# Patient Record
Sex: Female | Born: 1950 | Race: White | Hispanic: No | Smoking: Never smoker
Health system: Southern US, Community
[De-identification: ages and names within clinical notes are randomized; demographics above are authoritative.]

## PROBLEM LIST (undated history)

## (undated) DIAGNOSIS — R112 Nausea with vomiting, unspecified: Secondary | ICD-10-CM

## (undated) DIAGNOSIS — T148XXA Other injury of unspecified body region, initial encounter: Secondary | ICD-10-CM

## (undated) DIAGNOSIS — K219 Gastro-esophageal reflux disease without esophagitis: Secondary | ICD-10-CM

## (undated) DIAGNOSIS — M810 Age-related osteoporosis without current pathological fracture: Principal | ICD-10-CM

## (undated) DIAGNOSIS — Z9889 Other specified postprocedural states: Secondary | ICD-10-CM

## (undated) DIAGNOSIS — M8580 Other specified disorders of bone density and structure, unspecified site: Secondary | ICD-10-CM

## (undated) HISTORY — DX: Other specified postprocedural states: Z98.890

## (undated) HISTORY — DX: Gastro-esophageal reflux disease without esophagitis: K21.9

## (undated) HISTORY — DX: Other specified disorders of bone density and structure, unspecified site: M85.80

## (undated) HISTORY — DX: Other injury of unspecified body region, initial encounter: T14.8XXA

## (undated) HISTORY — DX: Age-related osteoporosis without current pathological fracture: M81.0

## (undated) HISTORY — PX: AUGMENTATION MAMMAPLASTY: SUR837

## (undated) HISTORY — DX: Other specified postprocedural states: R11.2

## (undated) HISTORY — DX: Nausea with vomiting, unspecified: R11.2

---

## 1998-04-24 ENCOUNTER — Ambulatory Visit (HOSPITAL_COMMUNITY): Admission: RE | Admit: 1998-04-24 | Discharge: 1998-04-24 | Payer: Self-pay | Admitting: Internal Medicine

## 2000-06-15 ENCOUNTER — Ambulatory Visit (HOSPITAL_COMMUNITY): Admission: RE | Admit: 2000-06-15 | Discharge: 2000-06-15 | Payer: Self-pay | Admitting: Obstetrics and Gynecology

## 2000-06-15 ENCOUNTER — Encounter: Payer: Self-pay | Admitting: Obstetrics and Gynecology

## 2000-06-29 ENCOUNTER — Other Ambulatory Visit: Admission: RE | Admit: 2000-06-29 | Discharge: 2000-06-29 | Payer: Self-pay | Admitting: Obstetrics and Gynecology

## 2002-11-15 ENCOUNTER — Other Ambulatory Visit: Admission: RE | Admit: 2002-11-15 | Discharge: 2002-11-15 | Payer: Self-pay | Admitting: Obstetrics and Gynecology

## 2003-12-09 ENCOUNTER — Ambulatory Visit (HOSPITAL_COMMUNITY): Admission: RE | Admit: 2003-12-09 | Discharge: 2003-12-09 | Payer: Self-pay | Admitting: Obstetrics and Gynecology

## 2003-12-18 ENCOUNTER — Other Ambulatory Visit: Admission: RE | Admit: 2003-12-18 | Discharge: 2003-12-18 | Payer: Self-pay | Admitting: Obstetrics and Gynecology

## 2003-12-19 ENCOUNTER — Other Ambulatory Visit: Admission: RE | Admit: 2003-12-19 | Discharge: 2003-12-19 | Payer: Self-pay | Admitting: Obstetrics and Gynecology

## 2004-07-29 ENCOUNTER — Other Ambulatory Visit: Admission: RE | Admit: 2004-07-29 | Discharge: 2004-07-29 | Payer: Self-pay | Admitting: Obstetrics and Gynecology

## 2005-06-02 ENCOUNTER — Other Ambulatory Visit: Admission: RE | Admit: 2005-06-02 | Discharge: 2005-06-02 | Payer: Self-pay | Admitting: Obstetrics and Gynecology

## 2006-06-24 ENCOUNTER — Emergency Department (HOSPITAL_COMMUNITY): Admission: EM | Admit: 2006-06-24 | Discharge: 2006-06-24 | Payer: Self-pay | Admitting: Emergency Medicine

## 2006-06-30 ENCOUNTER — Ambulatory Visit: Payer: Self-pay | Admitting: Internal Medicine

## 2006-09-28 ENCOUNTER — Ambulatory Visit (HOSPITAL_COMMUNITY): Admission: RE | Admit: 2006-09-28 | Discharge: 2006-09-28 | Payer: Self-pay | Admitting: Obstetrics and Gynecology

## 2006-11-10 ENCOUNTER — Ambulatory Visit (HOSPITAL_COMMUNITY): Admission: RE | Admit: 2006-11-10 | Discharge: 2006-11-10 | Payer: Self-pay | Admitting: Obstetrics and Gynecology

## 2007-05-17 ENCOUNTER — Ambulatory Visit (HOSPITAL_COMMUNITY): Admission: RE | Admit: 2007-05-17 | Discharge: 2007-05-18 | Payer: Self-pay | Admitting: Surgery

## 2007-10-19 ENCOUNTER — Ambulatory Visit (HOSPITAL_COMMUNITY): Admission: RE | Admit: 2007-10-19 | Discharge: 2007-10-19 | Payer: Self-pay | Admitting: Obstetrics and Gynecology

## 2007-11-02 ENCOUNTER — Ambulatory Visit: Payer: Self-pay | Admitting: Internal Medicine

## 2007-11-23 ENCOUNTER — Encounter: Payer: Self-pay | Admitting: Family

## 2007-11-23 ENCOUNTER — Ambulatory Visit: Payer: Self-pay | Admitting: Internal Medicine

## 2007-11-23 HISTORY — PX: COLONOSCOPY: SHX174

## 2007-12-13 HISTORY — PX: HERNIA REPAIR: SHX51

## 2008-11-17 ENCOUNTER — Ambulatory Visit (HOSPITAL_COMMUNITY): Admission: RE | Admit: 2008-11-17 | Discharge: 2008-11-17 | Payer: Self-pay | Admitting: Obstetrics and Gynecology

## 2008-12-23 LAB — CONVERTED CEMR LAB: Pap Smear: NORMAL

## 2009-08-14 ENCOUNTER — Ambulatory Visit (HOSPITAL_COMMUNITY): Admission: RE | Admit: 2009-08-14 | Discharge: 2009-08-14 | Payer: Self-pay | Admitting: Obstetrics and Gynecology

## 2009-10-23 ENCOUNTER — Ambulatory Visit: Payer: Self-pay | Admitting: Family

## 2009-10-23 ENCOUNTER — Encounter: Payer: Self-pay | Admitting: Internal Medicine

## 2009-10-23 LAB — CONVERTED CEMR LAB
AST: 40 units/L — ABNORMAL HIGH (ref 0–37)
BUN: 10 mg/dL (ref 6–23)
Basophils Relative: 1 % (ref 0–1)
Bilirubin Urine: NEGATIVE
CO2: 27 meq/L (ref 19–32)
Chloride: 101 meq/L (ref 96–112)
Cholesterol: 151 mg/dL (ref 0–200)
Creatinine, Ser: 0.8 mg/dL (ref 0.40–1.20)
Glucose, Bld: 88 mg/dL (ref 70–99)
Ketones, ur: NEGATIVE mg/dL
LDL Cholesterol: 71 mg/dL (ref 0–99)
Leukocytes, UA: NEGATIVE
Lymphocytes Relative: 31 % (ref 12–46)
MCHC: 33.1 g/dL (ref 30.0–36.0)
Neutrophils Relative %: 57 % (ref 43–77)
Platelets: 379 10*3/uL (ref 150–400)
Protein, ur: NEGATIVE mg/dL
RDW: 13.3 % (ref 11.5–15.5)
Specific Gravity, Urine: 1.017 (ref 1.005–1.030)
TSH: 2.396 microintl units/mL (ref 0.350–4.500)
Total Bilirubin: 0.6 mg/dL (ref 0.3–1.2)
Total CHOL/HDL Ratio: 2.2
VLDL: 11 mg/dL (ref 0–40)
WBC: 4.6 10*3/uL (ref 4.0–10.5)
pH: 8 (ref 5.0–8.0)

## 2009-11-02 ENCOUNTER — Ambulatory Visit: Payer: Self-pay | Admitting: Family

## 2009-11-02 DIAGNOSIS — K219 Gastro-esophageal reflux disease without esophagitis: Secondary | ICD-10-CM | POA: Insufficient documentation

## 2009-11-02 DIAGNOSIS — M899 Disorder of bone, unspecified: Secondary | ICD-10-CM | POA: Insufficient documentation

## 2009-11-02 DIAGNOSIS — M949 Disorder of cartilage, unspecified: Secondary | ICD-10-CM

## 2009-11-04 LAB — CONVERTED CEMR LAB
ALT: 16 units/L (ref 0–35)
AST: 28 units/L (ref 0–37)
Albumin: 4.8 g/dL (ref 3.5–5.2)
Alkaline Phosphatase: 62 units/L (ref 39–117)
Bilirubin, Direct: 0.1 mg/dL (ref 0.0–0.3)
Indirect Bilirubin: 0.3 mg/dL (ref 0.0–0.9)
Total Bilirubin: 0.4 mg/dL (ref 0.3–1.2)
Total Protein: 7 g/dL (ref 6.0–8.3)

## 2009-11-06 ENCOUNTER — Encounter: Payer: Self-pay | Admitting: Family

## 2009-11-19 ENCOUNTER — Emergency Department (HOSPITAL_BASED_OUTPATIENT_CLINIC_OR_DEPARTMENT_OTHER): Admission: EM | Admit: 2009-11-19 | Discharge: 2009-11-20 | Payer: Self-pay | Admitting: Emergency Medicine

## 2009-11-25 ENCOUNTER — Ambulatory Visit (HOSPITAL_BASED_OUTPATIENT_CLINIC_OR_DEPARTMENT_OTHER): Admission: RE | Admit: 2009-11-25 | Discharge: 2009-11-25 | Payer: Self-pay | Admitting: Obstetrics and Gynecology

## 2009-11-25 ENCOUNTER — Ambulatory Visit: Payer: Self-pay | Admitting: Radiology

## 2009-12-03 ENCOUNTER — Telehealth: Payer: Self-pay | Admitting: Family

## 2010-05-10 ENCOUNTER — Emergency Department (HOSPITAL_BASED_OUTPATIENT_CLINIC_OR_DEPARTMENT_OTHER): Admission: EM | Admit: 2010-05-10 | Discharge: 2010-05-10 | Payer: Self-pay | Admitting: Emergency Medicine

## 2010-05-10 ENCOUNTER — Ambulatory Visit: Payer: Self-pay | Admitting: Radiology

## 2010-05-11 ENCOUNTER — Encounter: Payer: Self-pay | Admitting: Family

## 2010-05-11 HISTORY — PX: FRACTURE SURGERY: SHX138

## 2010-05-14 ENCOUNTER — Ambulatory Visit (HOSPITAL_BASED_OUTPATIENT_CLINIC_OR_DEPARTMENT_OTHER): Admission: RE | Admit: 2010-05-14 | Discharge: 2010-05-14 | Payer: Self-pay | Admitting: Orthopedic Surgery

## 2010-05-17 ENCOUNTER — Telehealth: Payer: Self-pay | Admitting: Family

## 2010-11-09 ENCOUNTER — Telehealth: Payer: Self-pay | Admitting: Family

## 2010-11-16 LAB — CONVERTED CEMR LAB
ALT: 14 units/L (ref 0–35)
AST: 26 units/L (ref 0–37)
Albumin: 4.9 g/dL (ref 3.5–5.2)
Basophils Relative: 1 % (ref 0–1)
CO2: 31 meq/L (ref 19–32)
Chloride: 102 meq/L (ref 96–112)
Creatinine, Ser: 0.83 mg/dL (ref 0.40–1.20)
Eosinophils Absolute: 0.1 10*3/uL (ref 0.0–0.7)
Glucose, Bld: 88 mg/dL (ref 70–99)
Hemoglobin: 13.6 g/dL (ref 12.0–15.0)
LDL Cholesterol: 63 mg/dL (ref 0–99)
Lymphocytes Relative: 24 % (ref 12–46)
Lymphs Abs: 1.5 10*3/uL (ref 0.7–4.0)
MCHC: 33.3 g/dL (ref 30.0–36.0)
MCV: 87.6 fL (ref 78.0–100.0)
Monocytes Relative: 7 % (ref 3–12)
Neutrophils Relative %: 67 % (ref 43–77)
Platelets: 411 10*3/uL — ABNORMAL HIGH (ref 150–400)
RBC: 4.66 M/uL (ref 3.87–5.11)
Sodium: 140 meq/L (ref 135–145)
TSH: 2.04 microintl units/mL (ref 0.350–4.500)
Total Protein: 7.2 g/dL (ref 6.0–8.3)
VLDL: 8 mg/dL (ref 0–40)
WBC: 6 10*3/uL (ref 4.0–10.5)

## 2010-11-22 ENCOUNTER — Encounter: Payer: Self-pay | Admitting: Family

## 2010-11-22 ENCOUNTER — Ambulatory Visit: Payer: Self-pay | Admitting: Family

## 2010-11-29 ENCOUNTER — Ambulatory Visit (HOSPITAL_BASED_OUTPATIENT_CLINIC_OR_DEPARTMENT_OTHER)
Admission: RE | Admit: 2010-11-29 | Discharge: 2010-11-29 | Payer: Self-pay | Source: Home / Self Care | Attending: Obstetrics and Gynecology | Admitting: Obstetrics and Gynecology

## 2011-01-11 NOTE — Progress Notes (Signed)
  Phone Note Outgoing Call   Call placed by: Lemont Fillers FNP,  May 17, 2010 12:05 PM Call placed to: Patient Summary of Call: Recieved consult from Dr. Gean Birchwood re: L ank bimaleolar fx/dislocation.  Pt is s/p ORIF.  Called patient and she tells me that her GYN repeated her bone density recently and it was unchanged since previous.  She was changed from actonel to fosamax.  Tells me that she has also completed mammogram and Pap since our last visit and both were normal. Initial call taken by: Lemont Fillers FNP,  May 17, 2010 12:10 PM    New/Updated Medications: FOSAMAX 70 MG TABS (ALENDRONATE SODIUM) one tab by mouth daily

## 2011-01-11 NOTE — Progress Notes (Signed)
Summary: CPX lab order  ---- Converted from flag ---- ---- 11/09/2010 12:14 PM, Lemont Fillers FNP wrote: CBC, BMET, LFT, TSH, FLP V70 Vitamin D (osteopenia) Thanks  ---- 11/09/2010 9:59 AM, Mervin Kung CMA (AAMA) wrote: Per Cindee Salt, pt is requesting labs prior to cpx. Please advise what labs and dx codes and I will send order to lab.  ---- 11/09/2010 9:49 AM, Roselle Locus wrote: patient scheduled cpx for 12-12 wants to do her labs on 12-5 please fax order ------------------------------  Phone Note Call from Patient   Summary of Call: Orders have been placed and faxed to the lab. Nicki Guadalajara Fergerson CMA Duncan Dull)  November 09, 2010 1:42 PM

## 2011-01-11 NOTE — Letter (Signed)
Summary: [facility]  [facility]   Imported By: Lanelle Bal 05/25/2010 11:52:09  _____________________________________________________________________  External Attachment:    Type:   Image     Comment:   External Document

## 2011-01-13 NOTE — Procedures (Signed)
Summary: Colonoscopy/[facility]  Colonoscopy/[facility]   Imported By: Lanelle Bal 12/10/2010 07:48:25  _____________________________________________________________________  External Attachment:    Type:   Image     Comment:   External Document

## 2011-01-13 NOTE — Assessment & Plan Note (Signed)
Summary: cpx/mhf--Rm 5   Vital Signs:  Patient profile:   60 year old female Menstrual status:  postmenopausal Height:      63.5 inches Weight:      109.25 pounds BMI:     19.12 Temp:     97.6 degrees F oral Pulse rate:   84 / minute Pulse rhythm:   regular Resp:     16 per minute BP sitting:   98 / 70  (right arm) Cuff size:   regular  Vitals Entered By: Mervin Kung CMA Duncan Dull) (November 22, 2010 9:09 AM) Is Patient Diabetic? No Pain Assessment Patient in pain? no      Comments Pt is only taking Fosamax at present. doesn't need Prilosec any longer. Nicki Guadalajara Fergerson CMA Duncan Dull)  November 22, 2010 9:20 AM    Primary Care Provider:  Lemont Fillers FNP   History of Present Illness: Ms. Witts is a 60 year old female who presents today for her annual CPX.  Preventative- patient reports that she exercises regularly. She is followed by Dr. Sheliah Mends of OB/GYN and reports that she is up-to-date on her Pap smear mammogram and DEXA scan. She reports that her most recent DEXA scan showed "worsening" bone density and Dr. Sheliah Mends changed her Actonel to Fosamax. colonoscopy- [facility]-  not in EMR- was told that it was normal.  She reports eating a very healthy diet.  Preventive Screening-Counseling & Management  Alcohol-Tobacco     Alcohol drinks/day: 0     Smoking Status: never  Caffeine-Diet-Exercise     Caffeine use/day: 2     Does Patient Exercise: yes     Type of exercise: waking, lifting at work     Exercise (avg: min/session): 30-60     Times/week: 7  Allergies (verified): No Known Drug Allergies  Past History:  Past Medical History: ankle fracture 5/11  Past Surgical History: Fracture left ankle ORIF-- May 11, 2010 umbilical hernia repair/bilateral inguinal hernia repair 2009  Family History: Family History Other cancer-uterine cancer-sister Family History of Arthritis-maternal grandmother Family History Hypertension-father Niece-- ?lymphoma, lung  cancer  Mom- living alive and well  Dad- deceased at age 48, HTN, kidney failure.  4 sisters- oldest sister- arthritis, youngest sister uterine cancer  daugher heidi 26- healthy Hailey 72- healthy  Social History: Occupation: Works at [employer] Married, 2 daughters live locally Never Smoked Alcohol use-no Regular exercise-yes 2 girls  Review of Systems       Constitutional: Denies Fever ENT:  Denies nasal congestion or sore throat. Resp: Denies cough CV:  Denies Chest Pain GI:  Denies nausea or vomitting, diarrhea GU: Denies dysuria Lymphatic: Denies lymphadenopathy Musculoskeletal:  Denies muscle/joint pain Skin:  Denies Rashes, see derm Psychiatric: Denies depression Neuro: Denies numbness     Physical Exam  General:  Well-developed,well-nourished,in no acute distress; alert,appropriate and cooperative throughout examination Head:  Normocephalic and atraumatic without obvious abnormalities. No apparent alopecia or balding. Eyes:  PERRLA, sclera are clear without injection. Ears:  External ear exam shows no significant lesions or deformities.  Otoscopic examination reveals clear canals, tympanic membranes are intact bilaterally without bulging, retraction, inflammation or discharge. Hearing is grossly normal bilaterally. Mouth:  Oral mucosa and oropharynx without lesions or exudates.  Teeth in good repair. Neck:  No deformities, masses, or tenderness noted. Breasts:  deferred to GYN Lungs:  Normal respiratory effort, chest expands symmetrically. Lungs are clear to auscultation, no crackles or wheezes. Heart:  Normal rate and regular rhythm. S1 and S2 normal  without gallop, murmur, click, rub or other extra sounds. Abdomen:  Bowel sounds positive,abdomen soft and non-tender without masses, organomegaly or hernias noted. Genitalia:  deferred to GYN Msk:  No deformity or scoliosis noted of thoracic or lumbar spine.   Extremities:  No clubbing, cyanosis, edema, or  deformity noted with normal full range of motion of all joints.   Neurologic:  No cranial nerve deficits noted. Station and gait are normal. Bilateral Patellar DTRs are symmetrical. Sensory, motor and coordinative functions appear intact. Skin:  Intact without suspicious lesions or rashes Psych:  Cognition and judgment appear intact. Alert and cooperative with normal attention span and concentration. No apparent delusions, illusions, hallucinations   Impression & Recommendations:  Problem # 1:  PREVENTIVE HEALTH CARE (ICD-V70.0) Assessment Comment Only Reviewed her lab work with her which is satisfactory. Immunizations reviewed and up to date.  Pt encouraged to continue good work with hin healthy diet and exercise.  Baseline EKG performed today- no acute changes.   Complete Medication List: 1)  Fosamax 70 Mg Tabs (Alendronate sodium) .... One tab by mouth once weekly 2)  Target Chocolate Calcium Chews 600mg + D  .... One chew twice daily  Other Orders: EKG w/ Interpretation (93000) aa  Patient Instructions: 1)  Keep up the good work with your exercise and healthy eating. 2)  Keep your follow up appointment with GYN. 3)  Happy Holidays!   Orders Added: 1)  EKG w/ Interpretation [93000] 2)  Est. Patient 40-64 years [99396]   Immunization History:  Influenza Immunization History:    Influenza:  historical (10/01/2010)   Immunization History:  Influenza Immunization History:    Influenza:  Historical (10/01/2010)   Current Allergies (reviewed today): No known allergies    Vital Signs:  Patient Profile:   60 year old female Height:     63.5 inches Weight:      109.25 pounds BMI:     19.12 Temp:     97.6 degrees F oral Pulse rate:   84 / minute Pulse rhythm:   regular Resp:     16 per minute BP sitting:   98 / 70 Cuff size:   regular                  Preventive Care Screening  Mammogram:    Date:  10/12/2010    Results:  normal   Pap Smear:    Date:   01/12/2010    Results:  normal      Pt is scheduled for mammogram on 11/29/10. Nicki Guadalajara Fergerson CMA Duncan Dull)  November 22, 2010 9:21 AM

## 2011-01-31 ENCOUNTER — Other Ambulatory Visit (HOSPITAL_COMMUNITY): Payer: Self-pay | Admitting: Obstetrics and Gynecology

## 2011-02-07 ENCOUNTER — Ambulatory Visit (HOSPITAL_COMMUNITY)
Admission: RE | Admit: 2011-02-07 | Discharge: 2011-02-07 | Disposition: A | Discharge status: Home / Self Care | Payer: Federal, State, Local not specified - PPO | Source: Ambulatory Visit | Attending: Obstetrics and Gynecology | Admitting: Obstetrics and Gynecology

## 2011-02-07 DIAGNOSIS — M810 Age-related osteoporosis without current pathological fracture: Secondary | ICD-10-CM | POA: Insufficient documentation

## 2011-02-28 LAB — POCT HEMOGLOBIN-HEMACUE: Hemoglobin: 12.3 g/dL (ref 12.0–15.0)

## 2011-04-26 NOTE — Op Note (Signed)
Amy [name], Amy [name]               ACCOUNT NO.:  [id number]   MEDICAL RECORD NO.:  [id number]          PATIENT TYPE:  AMB   LOCATION:  DAY                          FACILITY:  [facility]   PHYSICIAN:  Thomas A. Cornett, M.D.DATE OF BIRTH:  [birth year]   DATE OF PROCEDURE:  05/17/2007  DATE OF DISCHARGE:                               OPERATIVE REPORT   PREOPERATIVE DIAGNOSIS:  Bilateral inguinal hernia and small ventral  hernia.   POSTOPERATIVE DIAGNOSIS:  1. Right femoral hernia incarcerated with fat.  2. Left inguinal hernia incarcerated with fat.  3. Small supraumbilical ventral hernia.   PROCEDURE:  1. Transabdominal repair of right femoral hernia and left inguinal      hernia with mesh.  2. Primary repair of small ventral hernia.   SURGEON:  Maisie Fus A. Cornett, M.D.   ANESTHESIA:  General endotracheal anesthesia.   ESTIMATED BLOOD LOSS:  50 mL.   SPECIMENS:  None.   DRAINS:  None.   INDICATIONS FOR PROCEDURE:  The patient is a 60 year old female who has  developed bilateral groin herniae and a small ventral hernia.  She  presents today for repair.  We elected to do this laparoscopically since  she really wanted to have as small incision that she could have and  since these were bilateral, I felt these were amendable to a  laparoscopic approach.  Given her ventral hernia, I recommended  transabdominal approach to address this, if needed.  She understood and  agreed to proceed.   DESCRIPTION OF PROCEDURE:  The patient was brought to the operating room  and placed supine.  Both arms were tucked.  After induction of general  anesthesia, a Foley catheter was placed.  The abdomen was then prepped  and draped in a sterile fashion.  The supraumbilical hernia defect was  palpated was very small with her relaxed.  An incision was made over  this. I dissected down until I found the hernia and then opened this.  I  placed my finger in the abdominal cavity through this and there was  some  fat that I reduced back in.  The defect was very small, measuring  roughly 1 cm in maximal diameter.  I put a pursestring suture of 0  Vicryl around this and placed a 12-mm Hassan cannula through this for  visualization the abdominal cavity.  A camera was then placed.  Insufflation to 15 mmHg CO2 was begun.   We then entered the abdominal cavity using laparoscopy to examine the  intra-abdominal contents which all appeared normal.  Her two herniae  identified, one was a left inguinal hernia.  The one on the right was  very difficult to see.  I then used the cautery to open the  preperitoneal space.  Once we were able to open the entire prepared  space, I reflected this inferiorly until I encountered the pubis bones.  I then began to dissect in the right inguinal canal and I saw no hernia  there, but in the right femoral canal was an incarcerated right femoral  hernia.  I was able to reduce the contents  out of the hernia.  We were  then able to find the round ligament and dissect this out  circumferentially to create a space.  The left was addressed in a  similar fashion.  There was some omentum caught in the hernia defect and  I used the hook cautery to loosen this up and pull it out of the defect.  We then dissected further and reduced some peritoneum out of the right  hernia sac.  Once this was completely reduced, we could identify the  defect quite nicely.  There was a small vein that I placed a clip on  that came off the inferior epigastric vessels.   Once the defects were fully identified, Proceed mesh was used since  there was some small tears in the peritoneum.  I placed the blue side  toward the patient's body wall.  Once I positioned the mesh to cover  both defects, I used Tisseel to help affix the mesh.  The mesh on the  right side I did put additional tacks in the right upper outer corner to  help this lay flatter since we had a difficult time getting the mesh to  lay  flat.  I was able to get this fixed down into the pubic tubercle and  Cooper's ligament with glue fixation.  This took some time to the right  side but I was able to finally get the mesh where I was satisfied that  it covered the defect.  The left side was a  much smaller defect and,  again, I used a smaller piece of Proceed mesh, again, with the blue side  toward the patient.  We were able to cover the defect quite nicely on  this side and I used Tisseel with an adequate results where the mesh  stuck I thought nicely.   We insufflated the bladder with 200 mL of saline and methylene blue to  insure that we had not injured the bladder during the procedure and I  saw the bladder inflate without any extravasation of blue dye in the  intra-abdominal cavity or retroperitoneal space.  I had the nurse go  ahead and let that drain out since we were able to distend the bladder  and see no significant injury at 200 mL of saline and methylene blue.  At this time, I used a ProTack to pull the peritoneum back over the mesh  to close the prepared space.  She had very thin peritoneum, although we  were able to do this without too much tension or difficulty.  At this  point, I then let the CO2 escape and I watched the space close down to  make sure no loops of bowel were stuck on the repair, which did not  appear to be case.  There were no holes for bowel to slide through upon  examination, since all the defects were closed with the ProTack.   At this point, any excess irrigation we used was suctioned out.  I  pulled the ports out.  The subumbilical hernia was very small and I felt  closure with a simple stitch would be adequate, since we used it for  access into the intra-abdominal cavity.  We used 2-0 Vicryls to close  this.  At this point, all the CO2 was allowed to escape.  We close the  skin with a combination of Monocryl subcuticular stitches and Dermabond. All final counts of sponge, needle and  instruments were found to be  correct  at this portion of the case.  The patient was awakened and taken  to recovery in satisfactory condition.      Thomas A. Cornett, M.D.  Electronically Signed     TAC/MEDQ  D:  05/17/2007  T:  05/17/2007  Job:  [id number]   cc:   Randye Lobo, M.D.  Fax: [phone]

## 2011-04-29 NOTE — Assessment & Plan Note (Signed)
[facility] OFFICE NOTE   NAME:[name], [name]                      MRN:          [id number]  DATE:06/30/2006                            DOB:          [birth year]    60 year old female who is seen today for followup.  She suffered a  laceration to her scalp, vertex region, 6 days ago, and is seen today  basically for staple removal.  She is a former Building control surveyor Internal Medicine  patient.   She remains in excellent health.  She is a gravida 2, para 2, abortus 0, had  a tubal ligation following her second pregnancy.  Otherwise no operations or  illnesses.   She takes no medications.   She is a nonsmoker.   She does receive annual gynecologic care.   PHYSICAL EXAMINATION:  Examination of the scalp reveals 4 staples in the  vertex region of the scalp.  These were removed.  The incision was clean and  nicely healing.   IMPRESSION:  Staple removal.   DISPOSITION:  She will return at her convenience for a complete physical.  Colonoscopy will be set up at her convenience this year or next.  Is  scheduled for a gynecologic exam in the near future.                                   Amy Savers, MD   PFK/MedQ  DD:  06/30/2006  DT:  07/01/2006  Job #:  [id number]

## 2011-05-13 ENCOUNTER — Encounter: Payer: Self-pay | Admitting: Family

## 2011-09-29 LAB — DIFFERENTIAL
Basophils Relative: 0
Eosinophils Absolute: 0
Lymphocytes Relative: 25
Monocytes Relative: 7
Neutro Abs: 3.4
Neutrophils Relative %: 67

## 2011-09-29 LAB — CBC
Hemoglobin: 12.7
MCHC: 33.8
RBC: 4.3
WBC: 5

## 2011-09-29 LAB — BASIC METABOLIC PANEL
CO2: 30
Calcium: 9.2
Chloride: 105
Creatinine, Ser: 0.94
GFR calc Af Amer: >60
GFR calc non Af Amer: >60
Glucose, Bld: 96

## 2011-11-21 ENCOUNTER — Other Ambulatory Visit (HOSPITAL_COMMUNITY): Payer: Self-pay | Admitting: Obstetrics and Gynecology

## 2011-11-21 DIAGNOSIS — Z1231 Encounter for screening mammogram for malignant neoplasm of breast: Secondary | ICD-10-CM

## 2011-12-05 ENCOUNTER — Ambulatory Visit (HOSPITAL_BASED_OUTPATIENT_CLINIC_OR_DEPARTMENT_OTHER)
Admission: RE | Admit: 2011-12-05 | Discharge: 2011-12-05 | Disposition: A | Discharge status: Home / Self Care | Payer: Federal, State, Local not specified - PPO | Source: Ambulatory Visit | Attending: Obstetrics and Gynecology | Admitting: Obstetrics and Gynecology

## 2011-12-05 DIAGNOSIS — Z1231 Encounter for screening mammogram for malignant neoplasm of breast: Secondary | ICD-10-CM | POA: Insufficient documentation

## 2011-12-17 IMAGING — CR DG ANKLE COMPLETE 3+V*L*
3 series · 3 of 3 positions shown · non-contrast
Comparison: None.

CLINICAL DATA: Injury.  Left ankle pain.

LEFT ANKLE COMPLETE - 3+ VIEW

[t ankle joint ap left]
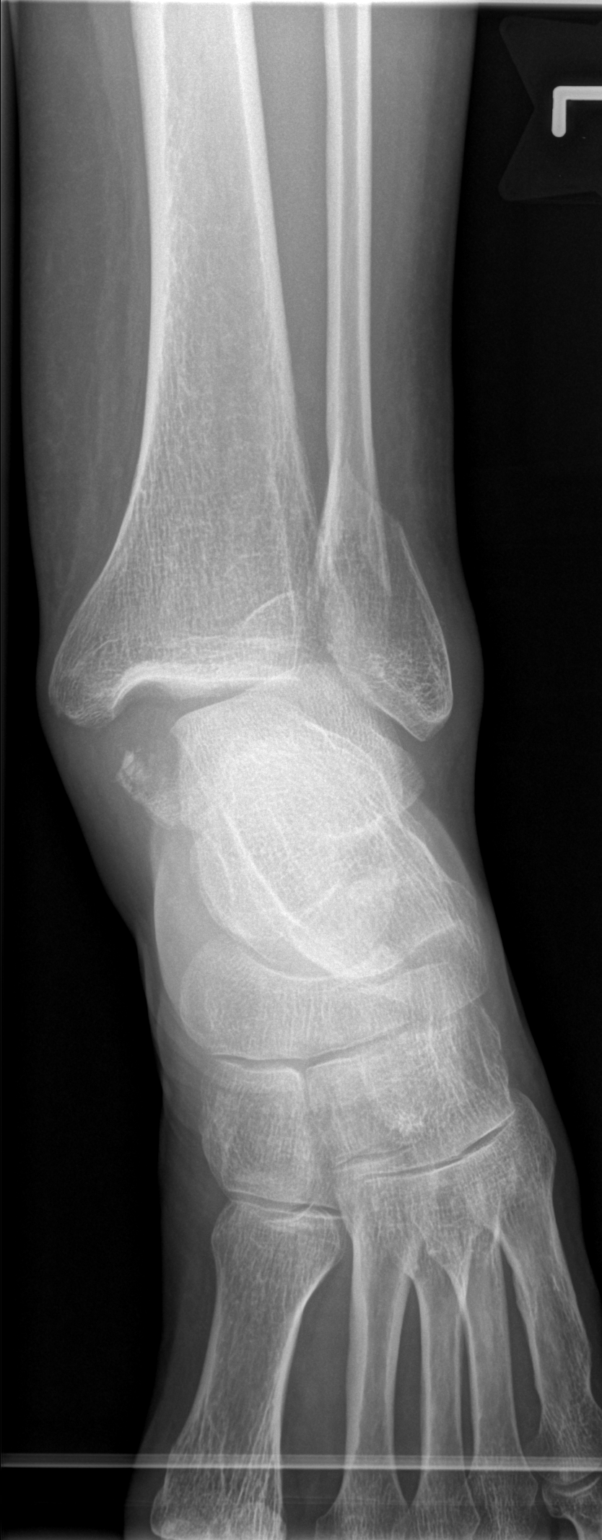

[t ankle joint oblique left]
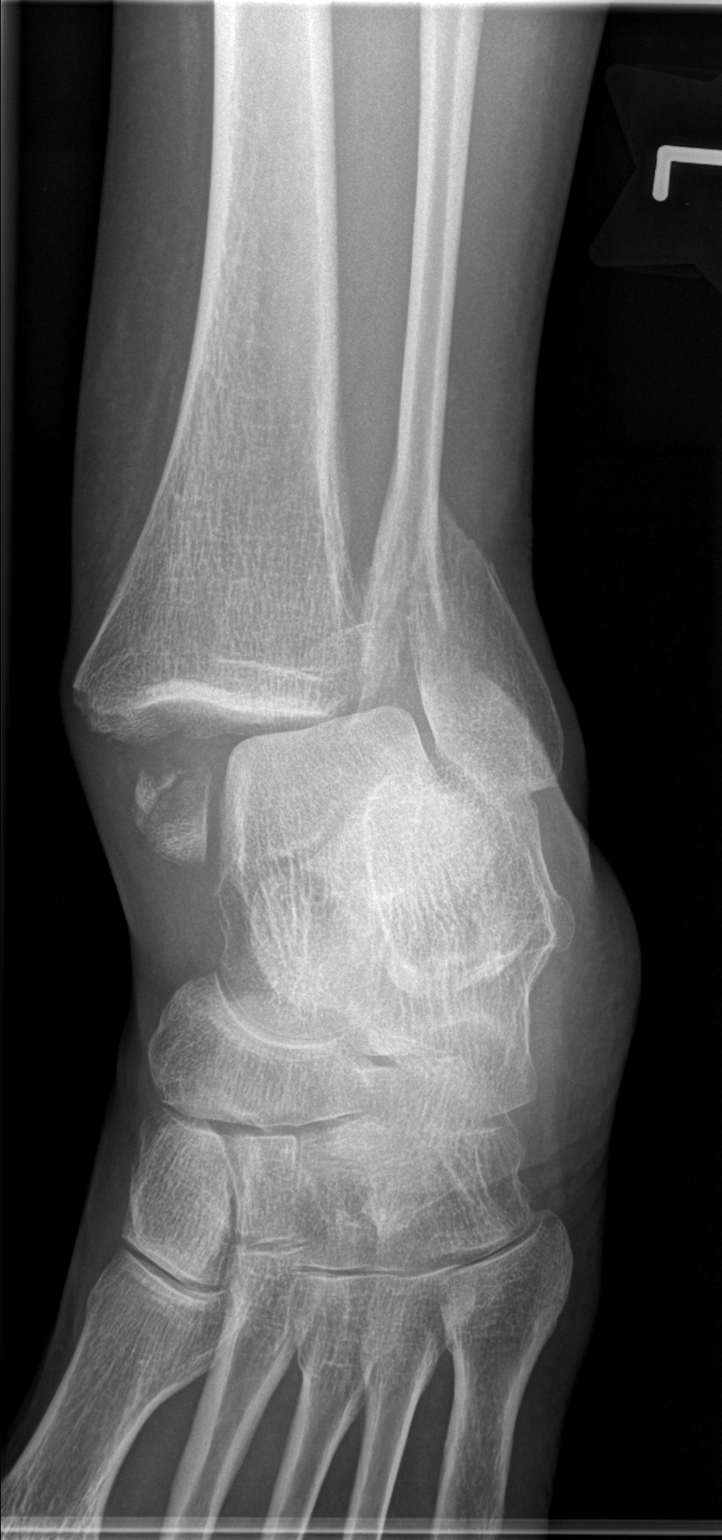

[t ankle joint lat left]
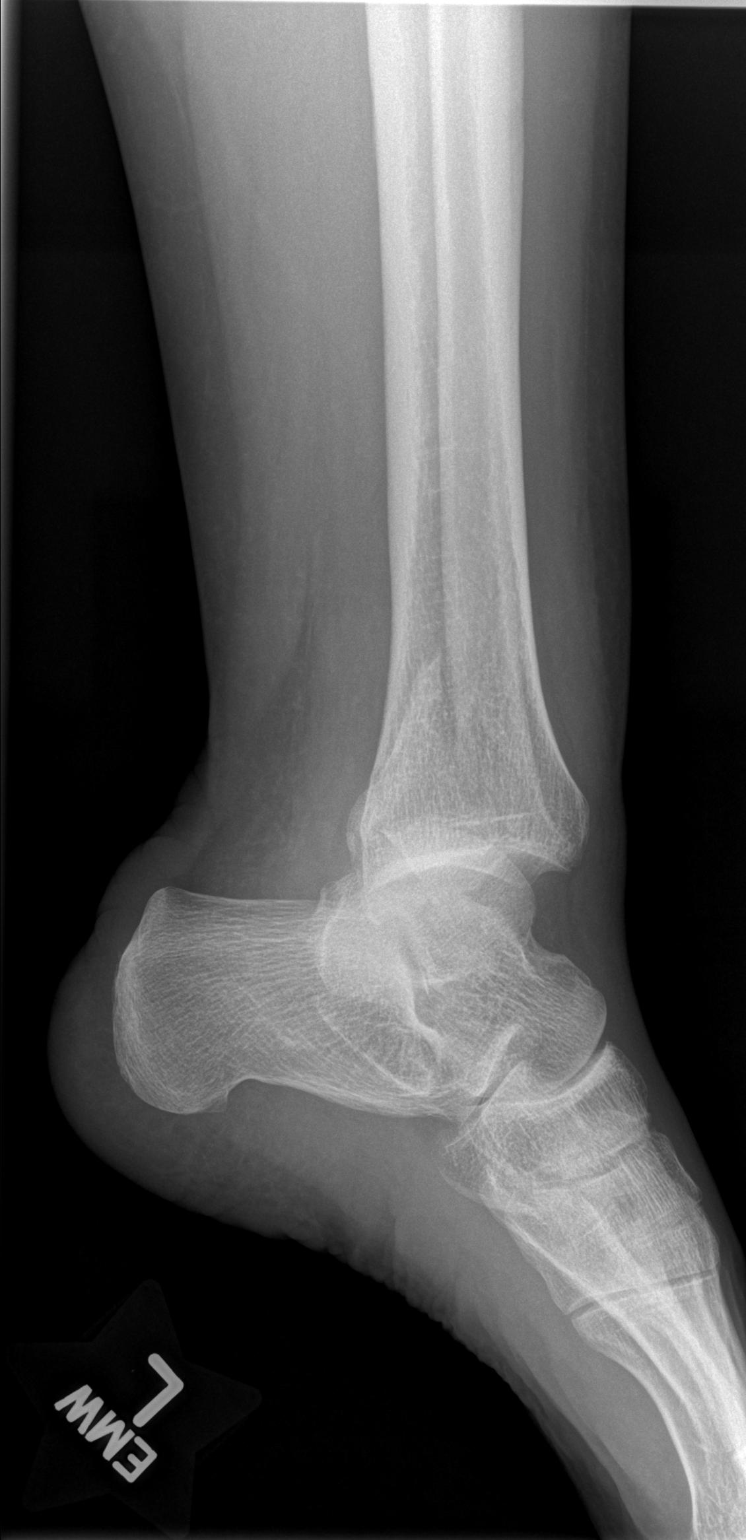

[3 of 3 positions shown; findings below may reference images not displayed]

FINDINGS: Displaced horizontal avulsion fracture of the medial
malleolus.  Oblique fracture of the distal fibula with lateral
displacement of the distal fracture fragment.  Posterior malleolus
intact.  Lateral talotibial subluxation.
IMPRESSION: Bimalleolar fracture.  Lateral talotibial subluxation.

## 2011-12-17 IMAGING — CR DG ANKLE 2V *L*
2 series · 2 of 2 positions shown · non-contrast
Comparison: Prereduction films from earlier today.

CLINICAL DATA: Ankle fracture.  Post reduction.

LEFT ANKLE - 2 VIEW

[view not recorded (1 of 2)]
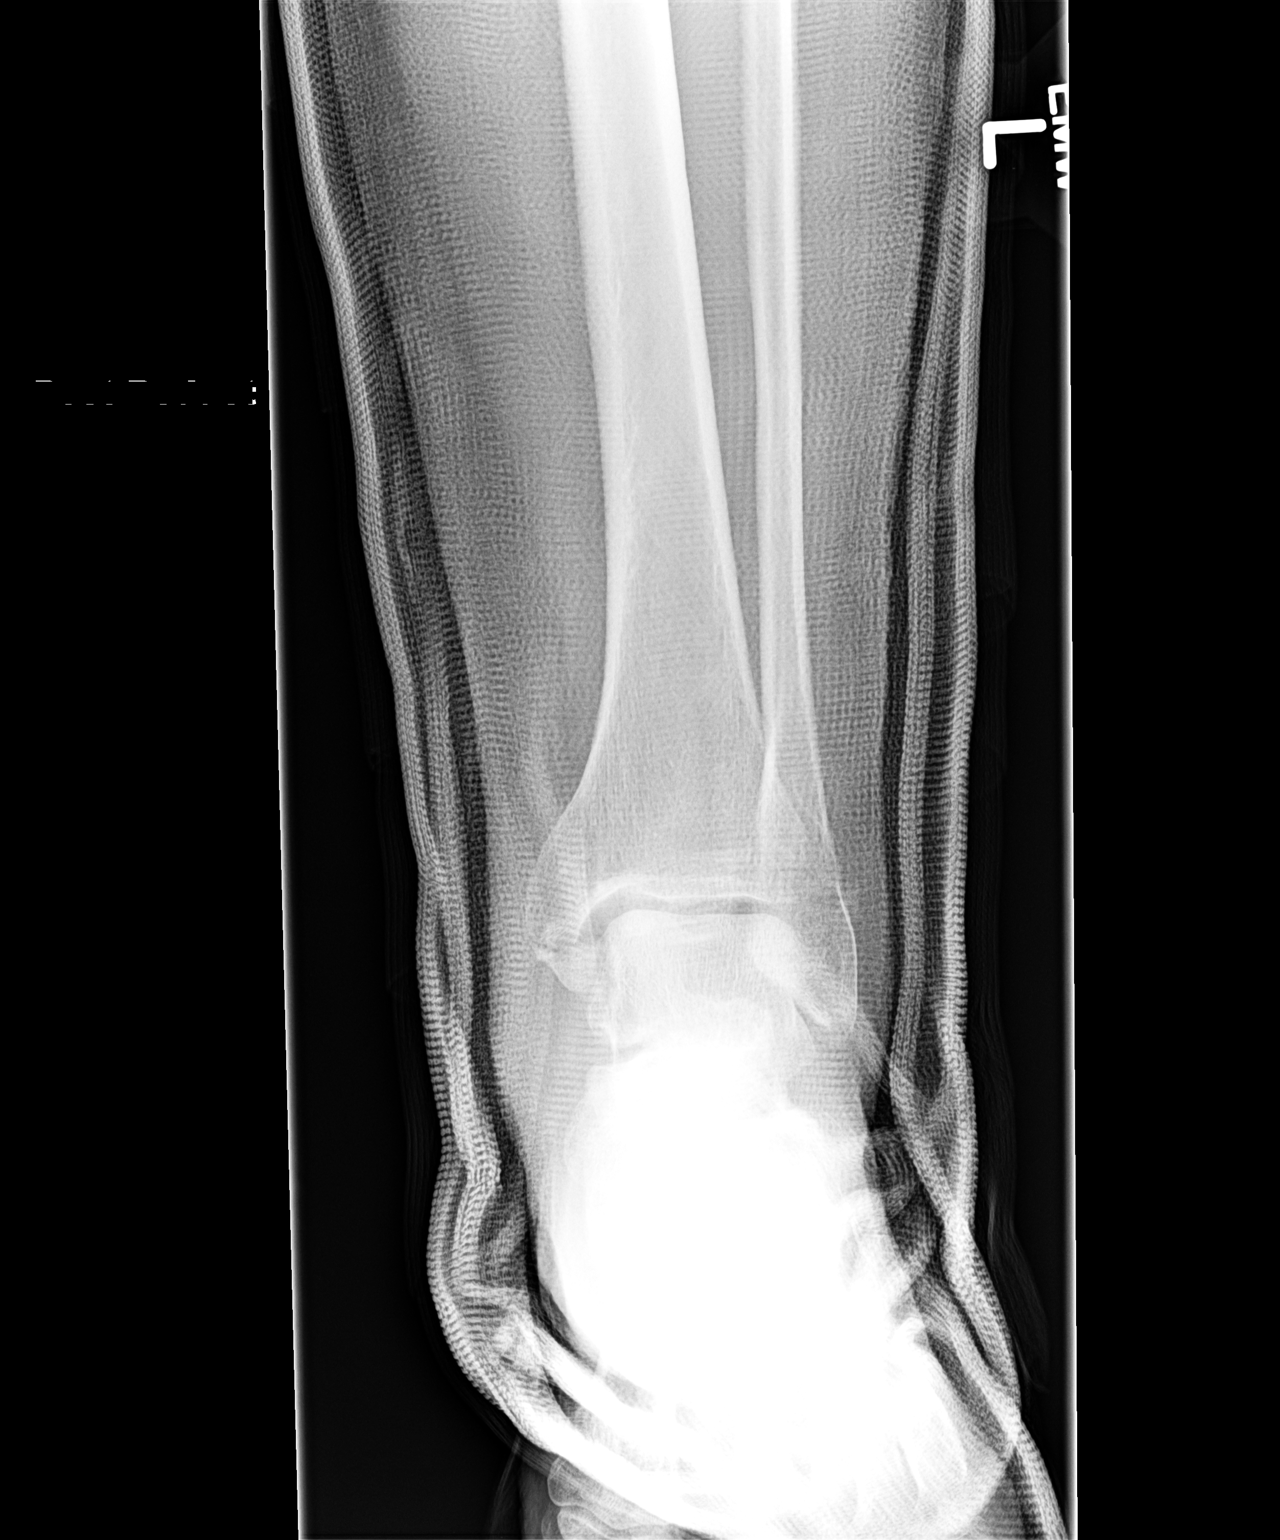

[view not recorded (2 of 2)]
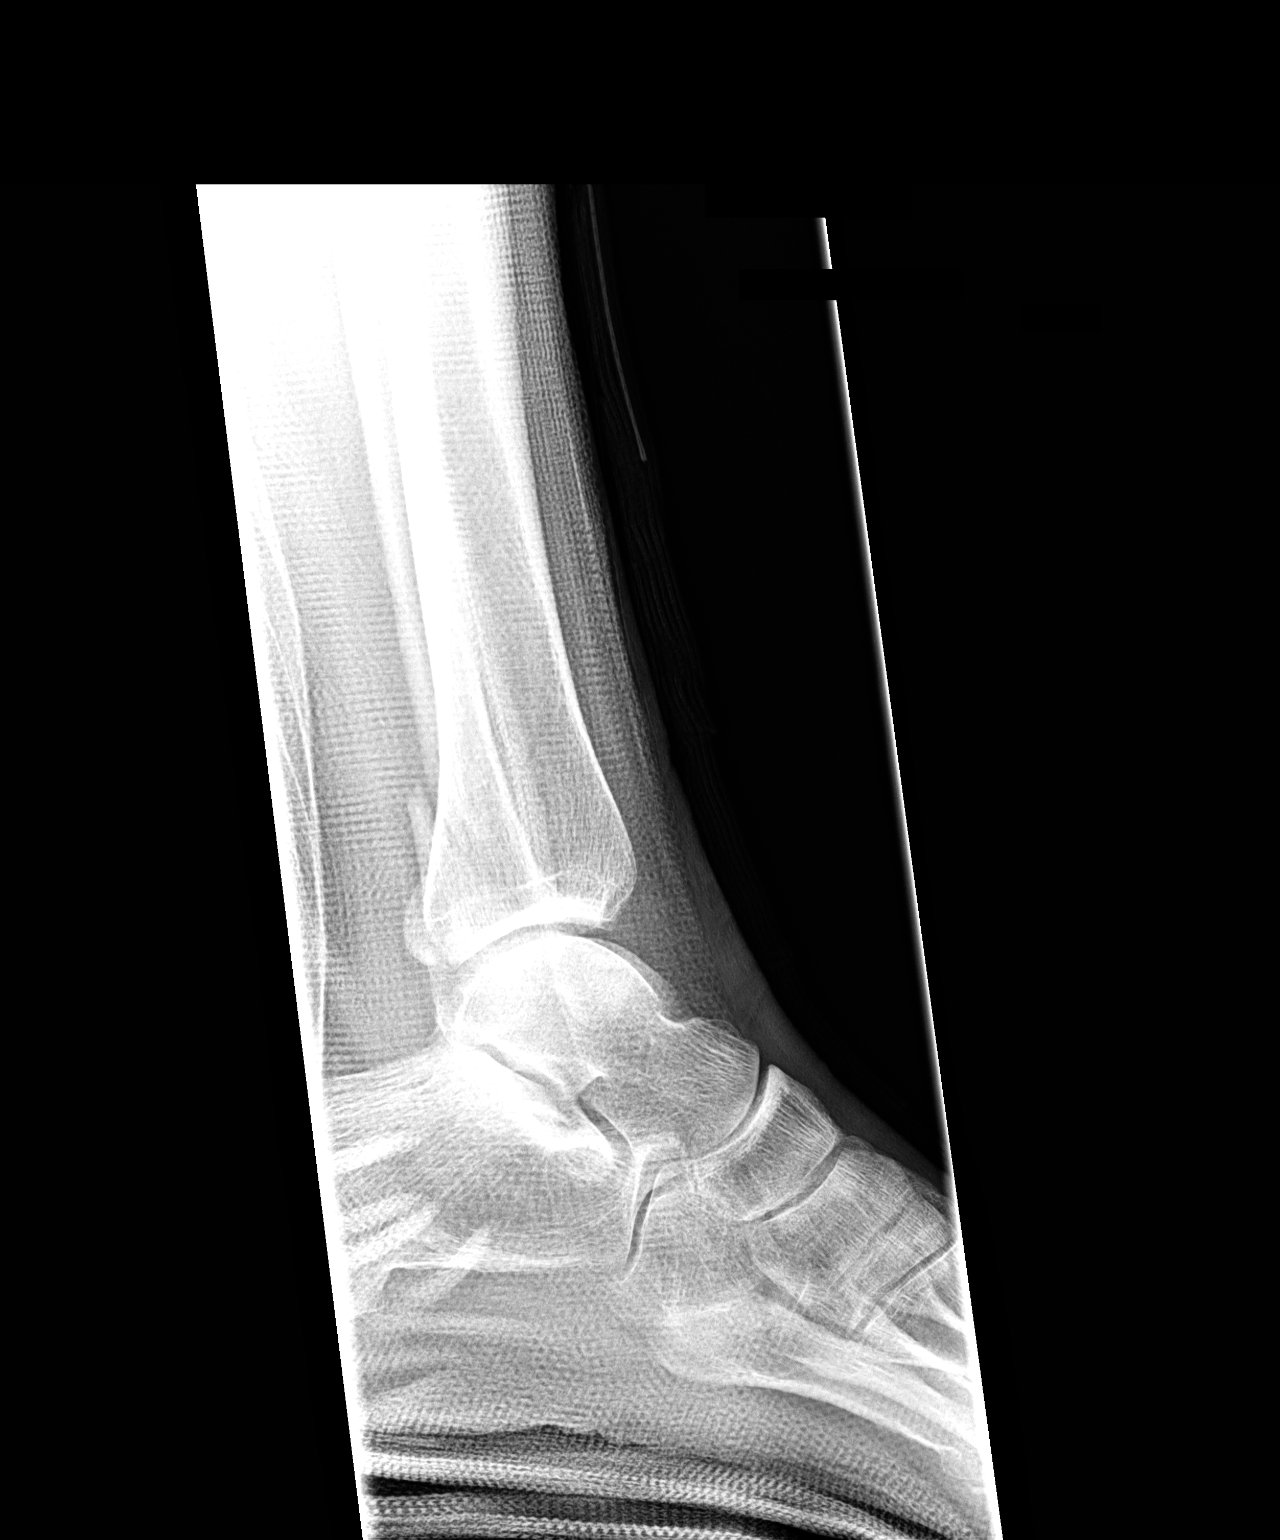

[2 of 2 positions shown; findings below may reference images not displayed]

FINDINGS: 0277 hours.  Fine bony detail is obscured by the
overlying fiberglass.  The fracture subluxation on the previous
film has been reduced with substantial improvement bony alignment.
Imaging features on the current study raise the question of a
posterior tibial lip fracture compatible with trimalleolar type
injury.
IMPRESSION: Marked interval improvement in bony alignment status post closed
reduction.

## 2012-10-11 ENCOUNTER — Telehealth: Payer: Self-pay | Admitting: Family

## 2012-10-11 DIAGNOSIS — Z0000 Encounter for general adult medical examination without abnormal findings: Secondary | ICD-10-CM

## 2012-10-11 NOTE — Telephone Encounter (Signed)
Please advise 

## 2012-10-11 NOTE — Telephone Encounter (Signed)
Labs pended below. 

## 2012-10-11 NOTE — Telephone Encounter (Signed)
Patient scheduled a cpe appointment with Melissa for 10/19/12. She insist's on doing blood work that morning before her appointment. I explained to patient that Melissa usually does not do it that way and that we will not have her results back in time for her appointment. She still insists on doing labs that morning. Can you order labs?

## 2012-10-15 NOTE — Telephone Encounter (Signed)
Orders printed and given to the lab.

## 2012-10-19 ENCOUNTER — Ambulatory Visit (INDEPENDENT_AMBULATORY_CARE_PROVIDER_SITE_OTHER): Payer: Federal, State, Local not specified - PPO | Admitting: Family

## 2012-10-19 ENCOUNTER — Other Ambulatory Visit (HOSPITAL_COMMUNITY)
Admission: RE | Admit: 2012-10-19 | Discharge: 2012-10-19 | Disposition: A | Discharge status: Home / Self Care | Payer: Federal, State, Local not specified - PPO | Source: Ambulatory Visit | Attending: Family | Admitting: Family

## 2012-10-19 ENCOUNTER — Encounter: Payer: Self-pay | Admitting: Family

## 2012-10-19 VITALS — BP 102/78 | HR 76 | Temp 98.0°F | Resp 16 | Wt 111.0 lb

## 2012-10-19 DIAGNOSIS — N63 Unspecified lump in unspecified breast: Secondary | ICD-10-CM

## 2012-10-19 DIAGNOSIS — N6320 Unspecified lump in the left breast, unspecified quadrant: Secondary | ICD-10-CM | POA: Insufficient documentation

## 2012-10-19 DIAGNOSIS — Z9882 Breast implant status: Secondary | ICD-10-CM

## 2012-10-19 DIAGNOSIS — Z978 Presence of other specified devices: Secondary | ICD-10-CM

## 2012-10-19 DIAGNOSIS — Z01419 Encounter for gynecological examination (general) (routine) without abnormal findings: Secondary | ICD-10-CM | POA: Insufficient documentation

## 2012-10-19 DIAGNOSIS — Z0000 Encounter for general adult medical examination without abnormal findings: Secondary | ICD-10-CM | POA: Insufficient documentation

## 2012-10-19 LAB — BASIC METABOLIC PANEL WITH GFR
Calcium: 9.5 mg/dL (ref 8.4–10.5)
Chloride: 100 mEq/L (ref 96–112)
Creat: 0.75 mg/dL (ref 0.50–1.10)
Glucose, Bld: 69 mg/dL — ABNORMAL LOW (ref 70–99)

## 2012-10-19 LAB — HEPATIC FUNCTION PANEL
AST: 26 U/L (ref 0–37)
Alkaline Phosphatase: 70 U/L (ref 39–117)
Bilirubin, Direct: 0.1 mg/dL (ref 0.0–0.3)
Indirect Bilirubin: 0.3 mg/dL (ref 0.0–0.9)
Total Bilirubin: 0.4 mg/dL (ref 0.3–1.2)

## 2012-10-19 LAB — CBC WITH DIFFERENTIAL/PLATELET
Basophils Absolute: 0 10*3/uL (ref 0.0–0.1)
Basophils Relative: 1 % (ref 0–1)
Eosinophils Relative: 2 % (ref 0–5)
HCT: 40.6 % (ref 36.0–46.0)
Lymphocytes Relative: 30 % (ref 12–46)
Lymphs Abs: 1.2 10*3/uL (ref 0.7–4.0)
MCH: 28.9 pg (ref 26.0–34.0)
MCV: 85.1 fL (ref 78.0–100.0)
Monocytes Absolute: 0.4 10*3/uL (ref 0.1–1.0)
Monocytes Relative: 9 % (ref 3–12)
Neutro Abs: 2.4 10*3/uL (ref 1.7–7.7)
Platelets: 442 10*3/uL — ABNORMAL HIGH (ref 150–400)

## 2012-10-19 LAB — LIPID PANEL: LDL Cholesterol: 76 mg/dL (ref 0–99)

## 2012-10-19 NOTE — Patient Instructions (Addendum)
You will be contact about your referral to Dr. Ellery Plunk and for your mammogram. Please let us know if you have not heard back within 1 week about your referral.

## 2012-10-19 NOTE — Assessment & Plan Note (Signed)
Fasting labwork completed today.  Pap performed.  Pt encouraged to continue healthy diet, exercise. Immunizations reviewed and up to date.

## 2012-10-19 NOTE — Addendum Note (Signed)
Addended by: Mervin Kung A on: 10/19/2012 08:29 AM   Modules accepted: Orders

## 2012-10-19 NOTE — Telephone Encounter (Signed)
Pt presented to the lab and future orders released. 

## 2012-10-19 NOTE — Assessment & Plan Note (Signed)
Will refer for diagnostic mammogram of the left breast.

## 2012-10-19 NOTE — Progress Notes (Signed)
Subjective:    Patient ID: Amy [name], female    DOB: 12/19/1950, 61 y.o.   MRN: [id number]  HPI  Patient presents today for complete physical.  Immunizations: had flu shot.   Diet: reports healthy diet Exercise: exercises every day- aerobics at home, walks during lunch. Active at her work.   Colonoscopy:last colo 2009. Dexa: 12/12 Pap Smear:last pap 18 months ago- pt reports always normal.   Mammogram: Last December at imaging.    Review of Systems  Constitutional: Negative for unexpected weight change.  HENT: Negative for hearing loss and congestion.   Eyes: Negative for visual disturbance.  Respiratory: Negative for cough.   Cardiovascular: Negative for leg swelling.  Gastrointestinal: Negative for nausea, vomiting, diarrhea, constipation and rectal pain.  Genitourinary: Negative for dysuria and frequency.  Musculoskeletal: Negative for myalgias.       Some left shoulder soreness due to lifting.  Tense.  Skin: Negative for rash.  Neurological: Negative for headaches.  Hematological: Negative for adenopathy.  Psychiatric/Behavioral:       Denies depression/anxiety   Past Medical History  Diagnosis Date  . Fracture     ankle fracture 5/11    History   Social History  . Marital Status: Widowed    Spouse Name: N/A    Number of Children: 2  . 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 Smoking status: Never Smoker   . Smokeless tobacco: Never Used  . Alcohol Use: No  . Drug Use: Not on file  . Sexually Active: Not on file   Other Topics Concern  . Not on file   Social History Narrative   Regular exercise:  Walks dailyCaffeine use:  2 cups coffee daily    Past Surgical History  Procedure Date  . Fracture surgery 05/11/2010    lft ankle ORIF  . Hernia repair 2009    umbilical hernia/bilat inguinal repair    Family History  Problem Relation Age of Onset  . Arthritis Sister   . Cancer  Sister     uterine cancer    Allergies  Allergen Reactions  . Alendronate Sodium Other (See Comments)    Tightness in throat and chest.    Current Outpatient Prescriptions on File Prior to Visit  Medication Sig Dispense Refill  . Calcium Carbonate-Vitamin D 600-400 MG-UNIT per chew tablet Chew 1 tablet by mouth 2 (two) times daily.        Marland Kitchen alendronate (FOSAMAX) 70 MG tablet Take 70 mg by mouth every 7 (seven) days. Take with a full glass of water on an empty stomach.         BP 102/78  Pulse 76  Temp 98 F (36.7 C) (Oral)  Resp 16  Wt 111 lb (50.349 kg)  SpO2 99%       Objective:   Physical Exam Physical Exam  Constitutional: She is oriented to person, place, and time. She appears well-developed and well-nourished. No distress.  HENT:  Head: Normocephalic and atraumatic.  Right Ear: Tympanic membrane and ear canal normal.  Left Ear: Tympanic membrane and ear canal normal.  Mouth/Throat: Oropharynx is clear and moist.  Eyes: Pupils are equal, round, and reactive to light. No scleral icterus.  Neck: Normal range of motion. No thyromegaly present.  Cardiovascular: Normal rate and regular rhythm.   No murmur heard. Pulmonary/Chest: Effort normal and breath sounds normal. No respiratory distress. He has no wheezes. She  has no rales. She exhibits no tenderness.  Abdominal: Soft. Bowel sounds are normal. He exhibits no distension and no mass. There is no tenderness. There is no rebound and no guarding.  Musculoskeletal: She exhibits no edema.  Lymphadenopathy:    She has no cervical adenopathy.  Neurological: She is alert and oriented to person, place, and time. She has normal reflexes. She exhibits normal muscle tone. Coordination normal.  Skin: Skin is warm and dry.  Psychiatric: She has a normal mood and affect. Her behavior is normal. Judgment and thought content normal.  Breasts: Examined lying R breast- implant noted,  Firm breast implants are noted.  No masses but  exam is limited due to scar tissue and implants. L Breast- marble sized slightly irregular mass is noted left breast at 5 oclock. Mobile/non-tender Inguinal/mons: Normal without inguinal adenopathy  External genitalia: Normal  BUS/Urethra/Skene's glands: Normal  Bladder: Normal  Vagina: Normal  Cervix: Normal  Uterus: normal in size, shape and contour. Midline and mobile  Adnexa/parametria:  Rt: Without masses or tenderness.  Lt: Without masses or tenderness.  Anus and perineum: Normal           Assessment & Plan:          Assessment & Plan:

## 2012-10-20 LAB — URINALYSIS, ROUTINE W REFLEX MICROSCOPIC
Bilirubin Urine: NEGATIVE
Glucose, UA: NEGATIVE mg/dL
Leukocytes, UA: NEGATIVE
Specific Gravity, Urine: 1.008 (ref 1.005–1.030)

## 2012-10-22 ENCOUNTER — Other Ambulatory Visit: Payer: Self-pay | Admitting: Family

## 2012-10-22 ENCOUNTER — Encounter: Payer: Self-pay | Admitting: Family

## 2012-10-22 DIAGNOSIS — N6320 Unspecified lump in the left breast, unspecified quadrant: Secondary | ICD-10-CM

## 2012-10-23 ENCOUNTER — Encounter: Payer: Self-pay | Admitting: Family

## 2012-12-06 ENCOUNTER — Other Ambulatory Visit: Payer: Federal, State, Local not specified - PPO

## 2012-12-07 ENCOUNTER — Ambulatory Visit
Admission: RE | Admit: 2012-12-07 | Discharge: 2012-12-07 | Disposition: A | Discharge status: Home / Self Care | Payer: Federal, State, Local not specified - PPO | Source: Ambulatory Visit | Attending: Family | Admitting: Family

## 2012-12-07 DIAGNOSIS — N6320 Unspecified lump in the left breast, unspecified quadrant: Secondary | ICD-10-CM

## 2013-10-17 ENCOUNTER — Other Ambulatory Visit: Payer: Self-pay

## 2013-11-26 ENCOUNTER — Telehealth: Payer: Self-pay | Admitting: Family

## 2013-11-26 DIAGNOSIS — Z0000 Encounter for general adult medical examination without abnormal findings: Secondary | ICD-10-CM

## 2013-11-26 NOTE — Telephone Encounter (Signed)
Requesting lab work prior to CPE on 12/03/13

## 2013-12-03 ENCOUNTER — Ambulatory Visit (INDEPENDENT_AMBULATORY_CARE_PROVIDER_SITE_OTHER): Payer: Federal, State, Local not specified - PPO | Admitting: Family

## 2013-12-03 ENCOUNTER — Other Ambulatory Visit: Payer: Self-pay | Admitting: Family

## 2013-12-03 ENCOUNTER — Encounter: Payer: Self-pay | Admitting: Family

## 2013-12-03 VITALS — BP 110/80 | HR 73 | Temp 97.6°F | Resp 16 | Ht 63.5 in | Wt 113.0 lb

## 2013-12-03 DIAGNOSIS — Z1231 Encounter for screening mammogram for malignant neoplasm of breast: Secondary | ICD-10-CM

## 2013-12-03 DIAGNOSIS — Z0000 Encounter for general adult medical examination without abnormal findings: Secondary | ICD-10-CM

## 2013-12-03 DIAGNOSIS — L989 Disorder of the skin and subcutaneous tissue, unspecified: Secondary | ICD-10-CM

## 2013-12-03 LAB — BASIC METABOLIC PANEL WITH GFR
Chloride: 99 mEq/L (ref 96–112)
GFR, Est African American: >89 mL/min
GFR, Est Non African American: 86 mL/min
Potassium: 4.4 mEq/L (ref 3.5–5.3)

## 2013-12-03 LAB — CBC WITH DIFFERENTIAL/PLATELET
Basophils Absolute: 0.1 10*3/uL (ref 0.0–0.1)
HCT: 38.6 % (ref 36.0–46.0)
Hemoglobin: 13.4 g/dL (ref 12.0–15.0)
Lymphocytes Relative: 19 % (ref 12–46)
Monocytes Absolute: 0.5 10*3/uL (ref 0.1–1.0)
Neutro Abs: 4.9 10*3/uL (ref 1.7–7.7)
Neutrophils Relative %: 72 % (ref 43–77)
RDW: 13.8 % (ref 11.5–15.5)
WBC: 6.8 10*3/uL (ref 4.0–10.5)

## 2013-12-03 LAB — LIPID PANEL
Cholesterol: 161 mg/dL (ref 0–200)
Total CHOL/HDL Ratio: 1.9 Ratio
Triglycerides: 42 mg/dL (ref <150)
VLDL: 8 mg/dL (ref 0–40)

## 2013-12-03 LAB — HEPATIC FUNCTION PANEL
ALT: 20 U/L (ref 0–35)
Bilirubin, Direct: 0.1 mg/dL (ref 0.0–0.3)
Indirect Bilirubin: 0.3 mg/dL (ref 0.0–0.9)
Total Bilirubin: 0.4 mg/dL (ref 0.3–1.2)

## 2013-12-03 MED ORDER — AMOXICILLIN 500 MG PO CAPS: 500.0000 mg | ORAL_CAPSULE | Freq: Three times a day (TID) | ORAL | Status: DC

## 2013-12-03 NOTE — Progress Notes (Signed)
Pre visit review using our clinic review tool, if applicable. No additional management support is needed unless otherwise documented below in the visit note. 

## 2013-12-03 NOTE — Patient Instructions (Addendum)
Please follow up in 1 year, sooner if problems/concerns.  Schedule mammogram after 12/26. Check with your insurance re: coverage for zostavax (shingles vaccine) and schedule a follow up appointment for nurse visit to receive vacccine.

## 2013-12-03 NOTE — Assessment & Plan Note (Signed)
Will send to dermatology for skin eval.

## 2013-12-03 NOTE — Assessment & Plan Note (Signed)
Continue healthy diet, exercise.  Plan pap next year (pt wants to see if her insurance will cover annual paps) she will also check insurance coverage re: zostavax coverage. She will schedule follow up mammogram.

## 2013-12-03 NOTE — Progress Notes (Signed)
Subjective:    Patient ID: Amy [name], female    DOB: [birth year], 62 y.o.   MRN: [id number]  HPI  Amy [name] is a 62 yr old female who presents today for cpx.  Immunizations:  Flu/tetanus up to date.  Requesting zostavax. Diet: reports healthy diet Exercise: reports regular exercise Colonoscopy: up to date Dexa: Due Pap Smear: up to date Mammogram: due  L breast mass-  Imaging noted ? Leakage of silicon.  She met with plastic surgeon and was told implant was OK, but that she had some scarring. Due for follow up screening mammo.  Skin lesions- notes several skin spots she would like evaluated.  Review of Systems  Constitutional: Negative for unexpected weight change.  HENT: Negative for hearing loss and rhinorrhea.   Eyes: Negative for visual disturbance.  Respiratory: Negative for cough and shortness of breath.   Cardiovascular: Negative for chest pain.  Gastrointestinal: Negative for vomiting, diarrhea and constipation.  Genitourinary: Negative for frequency and dyspareunia.  Musculoskeletal: Negative for arthralgias and myalgias.  Skin: Negative for rash.  Neurological: Negative for headaches.  Hematological: Negative for adenopathy.  Psychiatric/Behavioral:       Denies depression/anxiety       Past Medical History  Diagnosis Date  . Fracture     ankle fracture 5/11  . Osteopenia   . GERD (gastroesophageal reflux disease)     History   Social History  . Marital Status: Widowed    Spouse Name: N/A    Number of Children: 2  . 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 Smoking status: Never Smoker   . Smokeless tobacco: Never Used  . Alcohol Use: No  . Drug Use: Not on file  . Sexual Activity: Not on file   Other Topics Concern  . Not on file   Social History Narrative   Regular exercise:  Walks daily   Caffeine use:  2 cups coffee daily   Widowed- husband died in May 15, 2011   She has 2  daughters   No grand children          Past Surgical History  Procedure Laterality Date  . Fracture surgery  05/11/2010    lft ankle ORIF  . Hernia repair  2008/05/14    umbilical hernia/bilat inguinal repair    Family History  Problem Relation Age of Onset  . Arthritis Sister   . Cancer Sister     uterine cancer    Allergies  Allergen Reactions  . Alendronate Sodium Other (See Comments)    Tightness in throat and chest.    Current Outpatient Prescriptions on File Prior to Visit  Medication Sig Dispense Refill  . Calcium Carbonate-Vitamin D 600-400 MG-UNIT per chew tablet Chew 1 tablet by mouth 2 (two) times daily.        . Coenzyme Q10 (CO Q-10 PO) Take 1 tablet by mouth daily.      . fish oil-omega-3 fatty acids 1000 MG capsule Take 1 g by mouth daily.      . Multiple Vitamins-Minerals (MULTIVITAMIN WITH MINERALS) tablet Take 1 tablet by mouth daily.       No current facility-administered medications on file prior to visit.    BP 110/80  Pulse 73  Temp(Src) 97.6 F (36.4 C) (Oral)  Resp 16  Ht 5' 3.5" (1.613 m)  Wt 113 lb 0.6 oz (51.275 kg)  BMI 19.71 kg/m2  SpO2  99%    Objective:   Physical Exam  Skin:  hyperpigmented lesion left cheek right temple, right bridge of nose    Physical Exam  Constitutional: She is oriented to person, place, and time. She appears well-developed and well-nourished. No distress.  HENT:  Head: Normocephalic and atraumatic.  Right Ear: Tympanic membrane and ear canal normal.  Left Ear: Tympanic membrane and ear canal normal.  Mouth/Throat: Oropharynx is clear and moist.  Eyes: Pupils are equal, round, and reactive to light. No scleral icterus.  Neck: Normal range of motion. No thyromegaly present.  Cardiovascular: Normal rate and regular rhythm.   No murmur heard. Pulmonary/Chest: Effort normal and breath sounds normal. No respiratory distress. He has no wheezes. She has no rales. She exhibits no tenderness.  Abdominal: Soft.  Bowel sounds are normal. He exhibits no distension and no mass. There is no tenderness. There is no rebound and no guarding.  Musculoskeletal: She exhibits no edema.  Lymphadenopathy:    She has no cervical adenopathy.  Neurological: She is alert and oriented to person, place, and time. She has normal reflexes. She exhibits normal muscle tone. Coordination normal.  Skin: Skin is warm and dry.  Psychiatric: She has a normal mood and affect. Her behavior is normal. Judgment and thought content normal.  Breasts: Examined lying Right: Firm calcified breast implants bilaterally. Limited exam. No palpable masses, retractions, discharge or axillary adenopathy.  Left: rubber mass left lateral breast is unchanged.           Assessment & Plan:          Assessment & Plan:

## 2013-12-04 ENCOUNTER — Encounter: Payer: Self-pay | Admitting: Family

## 2013-12-04 LAB — URINALYSIS, ROUTINE W REFLEX MICROSCOPIC
Bilirubin Urine: NEGATIVE
Glucose, UA: NEGATIVE mg/dL
Hgb urine dipstick: NEGATIVE
Leukocytes, UA: NEGATIVE
Nitrite: NEGATIVE
Protein, ur: NEGATIVE mg/dL
Urobilinogen, UA: 0.2 mg/dL (ref 0.0–1.0)
pH: 8 (ref 5.0–8.0)

## 2013-12-09 ENCOUNTER — Telehealth: Payer: Self-pay | Admitting: Family

## 2013-12-09 NOTE — Telephone Encounter (Signed)
1)  Reviewed record and see no indication for amoxicillin rx sent on 12/03/13. Please advise if this was for this pt or intended for someone else?  2)  Also,  Pt has checked with her insurance and they do cover shingles vaccine in primary care setting or in the pharmacy.  Scheduled pt nurse visit for shingles vaccine on 12/18/13 at 8:30am.  3)  Pt states insurance will cover an annual pap smear under her routine exam.  Wants to know if she can come back just for the pap smear and will there be an additional charge for that visit? If additional charge will apply she states she will wait until she sees GYN to complete pap smear. Pt aware it will be next week for response.  Please advise.

## 2013-12-09 NOTE — Telephone Encounter (Signed)
SHE WAS CALLED BY HER PHARMACY AND TOLD SHE HAS AN RX FOR AMOXICILLAN TO PICK UP.  SHE IS NOT SICK AND WONDERS WHEY THIS WAS SENT IN FOR HER

## 2013-12-11 ENCOUNTER — Inpatient Hospital Stay (HOSPITAL_BASED_OUTPATIENT_CLINIC_OR_DEPARTMENT_OTHER): Admission: RE | Admit: 2013-12-11 | Payer: Federal, State, Local not specified - PPO | Source: Ambulatory Visit

## 2013-12-16 NOTE — Telephone Encounter (Signed)
Notified pt and she voices understanding. States she will see her GYN for pap smear.  Will keep nurse visit for shingles vaccine for 12/18/13. Will also bring name of in-network dermatologist for her "age spots" on her face.

## 2013-12-16 NOTE — Telephone Encounter (Signed)
Routine office visit charge will apply to pap smear visit.  OK to give shingles shot. Amoxicillin sent in error to her pharmacy- please cancel at pharmacy. Thank you.

## 2013-12-18 ENCOUNTER — Other Ambulatory Visit: Payer: Self-pay | Admitting: Family

## 2013-12-18 ENCOUNTER — Ambulatory Visit (INDEPENDENT_AMBULATORY_CARE_PROVIDER_SITE_OTHER): Payer: Federal, State, Local not specified - PPO | Admitting: Family

## 2013-12-18 ENCOUNTER — Ambulatory Visit (HOSPITAL_BASED_OUTPATIENT_CLINIC_OR_DEPARTMENT_OTHER)
Admission: RE | Admit: 2013-12-18 | Discharge: 2013-12-18 | Disposition: A | Discharge status: Home / Self Care | Payer: Federal, State, Local not specified - PPO | Source: Ambulatory Visit | Attending: Family | Admitting: Family

## 2013-12-18 DIAGNOSIS — Z2911 Encounter for prophylactic immunotherapy for respiratory syncytial virus (RSV): Secondary | ICD-10-CM

## 2013-12-18 DIAGNOSIS — Z23 Encounter for immunization: Secondary | ICD-10-CM

## 2013-12-18 DIAGNOSIS — Z1231 Encounter for screening mammogram for malignant neoplasm of breast: Secondary | ICD-10-CM

## 2013-12-18 MED ORDER — ZOSTER VACCINE LIVE 19400 UNT/0.65ML ~~LOC~~ SOLR: 0.6500 mL | Freq: Once | SUBCUTANEOUS | Status: DC

## 2014-01-08 ENCOUNTER — Telehealth: Payer: Self-pay | Admitting: *Deleted

## 2014-01-08 DIAGNOSIS — L814 Other melanin hyperpigmentation: Secondary | ICD-10-CM

## 2014-01-08 NOTE — Telephone Encounter (Signed)
Pt called requesting status of dermatology referral. Pt was requesting to see [facility] [place] for the age spots on her face that she states she mentioned at a previous visit.

## 2014-01-08 NOTE — Telephone Encounter (Signed)
Referral placed.

## 2014-10-31 ENCOUNTER — Telehealth: Payer: Self-pay | Admitting: Family

## 2014-10-31 DIAGNOSIS — Z0000 Encounter for general adult medical examination without abnormal findings: Secondary | ICD-10-CM

## 2014-10-31 NOTE — Telephone Encounter (Signed)
Bmet, cbc diff, tsh, ua with micro, lft, lipid panel Preven care code.

## 2014-10-31 NOTE — Telephone Encounter (Signed)
CPE will be 12/15/14.  Please advise.

## 2014-10-31 NOTE — Telephone Encounter (Signed)
[name] name: Amy [name], Amy [name] Relation to pt: self  Call back [phone]   Reason for call:   pt requesting  lab orders for CPE

## 2014-11-03 NOTE — Telephone Encounter (Signed)
Left detailed message on home # to call and schedule lab appt. Future orders entered.

## 2014-12-03 ENCOUNTER — Other Ambulatory Visit (INDEPENDENT_AMBULATORY_CARE_PROVIDER_SITE_OTHER): Payer: Federal, State, Local not specified - PPO

## 2014-12-03 ENCOUNTER — Encounter: Payer: Self-pay | Admitting: Family

## 2014-12-03 DIAGNOSIS — Z0000 Encounter for general adult medical examination without abnormal findings: Secondary | ICD-10-CM

## 2014-12-03 LAB — CBC WITH DIFFERENTIAL/PLATELET
Basophils Absolute: 0 10*3/uL (ref 0.0–0.1)
Basophils Relative: 0.6 % (ref 0.0–3.0)
Eosinophils Absolute: 0.1 10*3/uL (ref 0.0–0.7)
Eosinophils Relative: 2.4 % (ref 0.0–5.0)
HCT: 41.2 % (ref 36.0–46.0)
Hemoglobin: 13.5 g/dL (ref 12.0–15.0)
Lymphocytes Relative: 20.3 % (ref 12.0–46.0)
Lymphs Abs: 1.2 10*3/uL (ref 0.7–4.0)
MCHC: 32.9 g/dL (ref 30.0–36.0)
MCV: 87.1 fl (ref 78.0–100.0)
MONO ABS: 0.4 10*3/uL (ref 0.1–1.0)
Monocytes Relative: 7.4 % (ref 3.0–12.0)
Neutro Abs: 4.2 10*3/uL (ref 1.4–7.7)
Neutrophils Relative %: 69.3 % (ref 43.0–77.0)
PLATELETS: 440 10*3/uL — AB (ref 150.0–400.0)
RBC: 4.73 Mil/uL (ref 3.87–5.11)
RDW: 13.3 % (ref 11.5–15.5)
WBC: 6.1 10*3/uL (ref 4.0–10.5)

## 2014-12-03 LAB — URINALYSIS, ROUTINE W REFLEX MICROSCOPIC
BILIRUBIN URINE: NEGATIVE
Hgb urine dipstick: NEGATIVE
KETONES UR: NEGATIVE
Nitrite: NEGATIVE
PH: 7.5 (ref 5.0–8.0)
RBC / HPF: NONE SEEN (ref 0–?)
Specific Gravity, Urine: 1.01 (ref 1.000–1.030)
Total Protein, Urine: NEGATIVE
Urine Glucose: NEGATIVE
Urobilinogen, UA: 0.2 (ref 0.0–1.0)

## 2014-12-03 LAB — HEPATIC FUNCTION PANEL
ALBUMIN: 4.4 g/dL (ref 3.5–5.2)
ALT: 19 U/L (ref 0–35)
AST: 30 U/L (ref 0–37)
Alkaline Phosphatase: 62 U/L (ref 39–117)
Bilirubin, Direct: 0.1 mg/dL (ref 0.0–0.3)
Total Bilirubin: 0.4 mg/dL (ref 0.2–1.2)
Total Protein: 7.1 g/dL (ref 6.0–8.3)

## 2014-12-03 LAB — LIPID PANEL
Cholesterol: 153 mg/dL (ref 0–200)
HDL: 72.1 mg/dL (ref >39.00)
LDL Cholesterol: 74 mg/dL (ref 0–99)
NonHDL: 80.9
Total CHOL/HDL Ratio: 2
Triglycerides: 35 mg/dL (ref 0.0–149.0)
VLDL: 7 mg/dL (ref 0.0–40.0)

## 2014-12-03 LAB — TSH: TSH: 1.7 u[IU]/mL (ref 0.35–4.50)

## 2014-12-03 LAB — BASIC METABOLIC PANEL
BUN: 17 mg/dL (ref 6–23)
CHLORIDE: 101 meq/L (ref 96–112)
CO2: 28 mEq/L (ref 19–32)
CREATININE: 0.7 mg/dL (ref 0.4–1.2)
Calcium: 9.1 mg/dL (ref 8.4–10.5)
GFR: 98.39 mL/min (ref >60.00)
GLUCOSE: 93 mg/dL (ref 70–99)
POTASSIUM: 4.2 meq/L (ref 3.5–5.1)
Sodium: 136 mEq/L (ref 135–145)

## 2014-12-15 ENCOUNTER — Other Ambulatory Visit (HOSPITAL_COMMUNITY)
Admission: RE | Admit: 2014-12-15 | Discharge: 2014-12-15 | Disposition: A | Discharge status: Home / Self Care | Payer: Federal, State, Local not specified - PPO | Source: Ambulatory Visit | Attending: Family | Admitting: Family

## 2014-12-15 ENCOUNTER — Other Ambulatory Visit: Payer: Self-pay | Admitting: Family

## 2014-12-15 ENCOUNTER — Encounter: Payer: Self-pay | Admitting: Family

## 2014-12-15 ENCOUNTER — Ambulatory Visit (INDEPENDENT_AMBULATORY_CARE_PROVIDER_SITE_OTHER): Payer: Federal, State, Local not specified - PPO | Admitting: Family

## 2014-12-15 VITALS — BP 110/80 | HR 80 | Temp 98.1°F | Resp 16 | Ht 63.0 in | Wt 113.0 lb

## 2014-12-15 DIAGNOSIS — Z0000 Encounter for general adult medical examination without abnormal findings: Secondary | ICD-10-CM

## 2014-12-15 DIAGNOSIS — Z01419 Encounter for gynecological examination (general) (routine) without abnormal findings: Secondary | ICD-10-CM | POA: Insufficient documentation

## 2014-12-15 DIAGNOSIS — M8580 Other specified disorders of bone density and structure, unspecified site: Secondary | ICD-10-CM

## 2014-12-15 DIAGNOSIS — Z1231 Encounter for screening mammogram for malignant neoplasm of breast: Secondary | ICD-10-CM

## 2014-12-15 DIAGNOSIS — J069 Acute upper respiratory infection, unspecified: Secondary | ICD-10-CM

## 2014-12-15 MED ORDER — ASPIRIN 81 MG PO TBEC: 81.0000 mg | DELAYED_RELEASE_TABLET | Freq: Every day | ORAL | Status: DC

## 2014-12-15 NOTE — Progress Notes (Signed)
Pre visit review using our clinic review tool, if applicable. No additional management support is needed unless otherwise documented below in the visit note. 

## 2014-12-15 NOTE — Patient Instructions (Signed)
Add aspirin  once daily. Follow up in 1 year,sooner if problems/concerns. You will be contacted about your bone density. Please schedule your mammogram.

## 2014-12-15 NOTE — Progress Notes (Signed)
HPI: Ms. Blackwell  presents today for complete physical.  Immunizations: flu shot 09/19/2014; zoster 12/18/2013; TD 11/02/2014. Inquiring about pneumovax. Diet:eats 3-4 fruits/veggies per day. Very occasionally eats t beef/pork. A small amount of sweets daily. Wt Readings from Last 3 Encounters:  12/15/14 113 lb (51.256 kg)  12/03/13 113 lb 0.6 oz (51.275 kg)  10/19/12 111 lb (50.349 kg)   Exercise: walks for 1 hour daily. Colonoscopy: 04/14/08 Dexa: 04-15-11 Pap Smear: 10/23/2012 Mammogram:  12/18/2013-negative. Will do this month. Had eye exam 11/2014. Dental visit 08/2014.  Current issue Has had cold for about 1 week starting with sore throat. Nasal congestion began Friday. States eyes have been swollen, itchy, and watery for last 2 days. No longer has sore throat. Denies having seasonal allergies. Denies maxillary/frontal sinus pain. No ear pain.  ROS  See HPI  Past Medical History  Diagnosis Date  . Fracture     ankle fracture 5/11  . Osteopenia   . GERD (gastroesophageal reflux disease)     History   Social History  . Marital Status: Widowed    Spouse Name: N/A    Number of Children: 2  . 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 Smoking status: Never Smoker   . Smokeless tobacco: Never Used  . Alcohol Use: No  . Drug Use: Not on file  . Sexual Activity: Not on file   Other Topics Concern  . Not on file   Social History Narrative   Regular exercise:  Walks daily   Caffeine use:  2 cups coffee daily   Widowed- husband died in Apr 15, 2011   She has 2 daughters   No grand children          Past Surgical History  Procedure Laterality Date  . Fracture surgery  05/11/2010    lft ankle ORIF  . Hernia repair  04-14-2008    umbilical hernia/bilat inguinal repair    Family History  Problem Relation Age of Onset  . Arthritis Sister   . Cancer Sister     uterine cancer    Allergies  Allergen Reactions  .  Alendronate Sodium Other (See Comments)    Tightness in throat and chest.  . Codeine Nausea Only    Current Outpatient Prescriptions on File Prior to Visit  Medication Sig Dispense Refill  . Calcium Carbonate-Vitamin D 600-400 MG-UNIT per chew tablet Chew 1 tablet by mouth 2 (two) times daily.      . Coenzyme Q10 (CO Q-10 PO) Take 1 tablet by mouth daily.    . fish oil-omega-3 fatty acids 1000 MG capsule Take 1 g by mouth daily.    . Multiple Vitamins-Minerals (MULTIVITAMIN WITH MINERALS) tablet Take 1 tablet by mouth daily.     No current facility-administered medications on file prior to visit.    BP 110/80 mmHg  Pulse 80  Temp(Src) 98.1 F (36.7 C) (Oral)  Resp 16  Ht  (1.6 m)  Wt 113 lb (51.256 kg)  BMI 20.02 kg/m2  SpO2 99%    General Survey: Well-groomed, healthy-appearing. Responds appropriately to questions. Skin/Hair/Nails: Color pink. Skin warm and dry. Nails without clubbing or cyanosis. No suspicious nevi. No rash, petechiae, or ecchymosis. HEENT: Skull normocephalic/atraumatic. Sclera white, conjunctiva pink. PERRLA.  Acuity good to whispered voice. Bilateral tympanic membranes intact with good cone of light. Nasal mucosa pink, septum midline, no sinus tenderness.  Throat: Oral mucosa pink, dentition good,  pharynx without erythema or exudates. Neck - trachea midline. Neck supple. No thyromegaly. Lymph nodes: No cervical or axillary adenopathy. Thorax and Lungs: Thorax is symmetric with good expansion. Lungs resonant. Breath sounds vesicular without rales, rhonchi, wheezes. Respiration rate regular. CV: No carotid bruits. No thrills, pulsation, lifts/heaves felt at precordium. S1 and S2 heard. No murmurs or extra sounds.  Extremities warm without edema. No varicosities or stasis changes. Dorsalis pedis and posterior tibial pulses are 2+ and symmetric.  Abdomen:  Abdomen is rounded, soft, and non-tender with active bowel sounds. No palpable masses. No  hepatosplenomegaly. No costovertebral angle tenderness.  Musculoskeletal:  Full ROM in all joints of the upper and lower extremities. No swelling or deformity Neuro: Mental Status: Alert and cooperative. Oriented to person, place, and time. Thought process coherent. Cognitive processes intact. Motor: Good muscle bulk and tone. Strength 5/5 throughout.   drift. Sensory: Sharp, light touch, position, and vibration intact.  Reflexes:  Patellar 2+ and symmetric. Breasts: Pt has implants. Breasts symmetric and smooth. Nodule noted on each side laterally which pt states are part of implants. Had this checked by Dr. Ellery Plunk 01/2014.

## 2014-12-16 ENCOUNTER — Encounter: Payer: Self-pay | Admitting: Family

## 2014-12-16 LAB — CYTOLOGY - PAP

## 2014-12-18 DIAGNOSIS — J069 Acute upper respiratory infection, unspecified: Secondary | ICD-10-CM | POA: Insufficient documentation

## 2014-12-18 NOTE — Assessment & Plan Note (Signed)
Resolving.  Advise follow up if symptoms do not continue to improve.

## 2014-12-18 NOTE — Assessment & Plan Note (Addendum)
Continue healthy diet, exercise, immunizations up to date.  Pneumovax unlikely to be covered by her insurer until 3265. Add aspirin 81 mg once daily for cardiac prevention. Pt to schedule mammo, refer for dexa.

## 2014-12-19 ENCOUNTER — Ambulatory Visit (HOSPITAL_COMMUNITY)
Admission: RE | Admit: 2014-12-19 | Discharge: 2014-12-19 | Disposition: A | Discharge status: Home / Self Care | Payer: Federal, State, Local not specified - PPO | Source: Ambulatory Visit | Attending: Family | Admitting: Family

## 2014-12-19 ENCOUNTER — Encounter: Payer: Self-pay | Admitting: Internal Medicine

## 2014-12-19 DIAGNOSIS — Z1231 Encounter for screening mammogram for malignant neoplasm of breast: Secondary | ICD-10-CM | POA: Insufficient documentation

## 2014-12-19 DIAGNOSIS — M8580 Other specified disorders of bone density and structure, unspecified site: Secondary | ICD-10-CM

## 2014-12-22 ENCOUNTER — Telehealth: Payer: Self-pay | Admitting: Family

## 2014-12-22 ENCOUNTER — Encounter: Payer: Self-pay | Admitting: Family

## 2014-12-22 DIAGNOSIS — M810 Age-related osteoporosis without current pathological fracture: Secondary | ICD-10-CM

## 2014-12-22 HISTORY — DX: Age-related osteoporosis without current pathological fracture: M81.0

## 2014-12-22 NOTE — Telephone Encounter (Signed)
Left message on home number to return my call. 

## 2014-12-22 NOTE — Telephone Encounter (Signed)
Please contact pt and let her know that I reviewed her bone density and it is showing osteoporosis. I recommend that since she cannot take fosamax, that we initiate prolia injection to help with her bone health.  Could you please initiate insurance approval?   Also, I would like her to continue her calcium supplement, weight bearing exercise and return to the lab for vit D dx osteoporosis.

## 2014-12-23 NOTE — Telephone Encounter (Signed)
Notified pt. Pt states she is going to research Prolia information and will call us back tomorrow with her decision. Will set up lab appt for vit d at that time.

## 2014-12-24 NOTE — Addendum Note (Signed)
Addended by: Mervin KungFERGERSON, Kortland Nichols A on: 12/24/2014 09:21 AM   Modules accepted: Orders

## 2014-12-24 NOTE — Telephone Encounter (Signed)
Pt called back and states she is willing to proceed with Prolia. Please start insurance authorization.  Pt will return to the lab tomorrow at 8:15am. Lab order entered.

## 2014-12-25 ENCOUNTER — Other Ambulatory Visit (INDEPENDENT_AMBULATORY_CARE_PROVIDER_SITE_OTHER): Payer: Federal, State, Local not specified - PPO

## 2014-12-25 ENCOUNTER — Encounter: Payer: Self-pay | Admitting: Family

## 2014-12-25 DIAGNOSIS — M810 Age-related osteoporosis without current pathological fracture: Secondary | ICD-10-CM

## 2014-12-25 LAB — VITAMIN D 25 HYDROXY (VIT D DEFICIENCY, FRACTURES): VITD: 37.41 ng/mL (ref 30.00–100.00)

## 2015-01-01 NOTE — Telephone Encounter (Signed)
I have electronically sent pt's info for Prolia insurance verification and will notify you once I have a response. Thank you. °

## 2015-01-12 NOTE — Telephone Encounter (Signed)
Left message on home # to return my call. 

## 2015-01-12 NOTE — Telephone Encounter (Signed)
I'm not sure what happened to the mssg below, I was adding it and got knocked out of Epic. Anyway, I have rec'd pt's Prolia insurance verification.  I can't give an estimate of what pt will owe for her injection.  Since she has a regular BCBS plan I do not know what their allowable billed amt is.  If an OV is billed pt will be responsible for a $30 co-pay plus 20% for Prolia and admin; if no OV is billed she will have a 20% co-insurance.  She can call her insurance and see if they will tell her what the allowable amt paid will be and calculate 20% of that.  I'm sorry this is so vague, but it's all I have.  Pt would probably come out better if she calls Prolia to see if she qualifies for their $25 co-pay card.  If she does they will instruct her how to proceed.  Their # is [phone]. I have sent a copy of the summary of benefits to be scanned into her chart. Please let me know if you have any questions. Thank you.

## 2015-01-12 NOTE — Telephone Encounter (Signed)
I have rec'd pt's Prolia insurance verification

## 2015-01-20 NOTE — Telephone Encounter (Signed)
Notified pt. She is still undecided about proceeding with Prolia. Notes that possible side effect could be throat tightness and she states she has had that side effect with oral agents in the past. Pt is also looking into another option that her sister has tried as well as looking into some natural alternatives. Pt will let us know how she wants to proceed.

## 2015-04-10 NOTE — Telephone Encounter (Signed)
error:[id number] ° °

## 2015-12-17 ENCOUNTER — Telehealth: Payer: Self-pay

## 2015-12-17 NOTE — Telephone Encounter (Signed)
Pre Visit call completed. 

## 2015-12-18 ENCOUNTER — Ambulatory Visit (INDEPENDENT_AMBULATORY_CARE_PROVIDER_SITE_OTHER): Payer: Federal, State, Local not specified - PPO | Admitting: Family

## 2015-12-18 ENCOUNTER — Encounter: Payer: Self-pay | Admitting: Family

## 2015-12-18 ENCOUNTER — Other Ambulatory Visit (HOSPITAL_COMMUNITY)
Admission: RE | Admit: 2015-12-18 | Discharge: 2015-12-18 | Disposition: A | Discharge status: Home / Self Care | Payer: Federal, State, Local not specified - PPO | Source: Ambulatory Visit | Attending: Family Medicine | Admitting: Family Medicine

## 2015-12-18 VITALS — BP 115/68 | HR 71 | Temp 97.5°F | Resp 16 | Ht 63.0 in | Wt 112.6 lb

## 2015-12-18 DIAGNOSIS — Z01419 Encounter for gynecological examination (general) (routine) without abnormal findings: Secondary | ICD-10-CM | POA: Diagnosis not present

## 2015-12-18 DIAGNOSIS — Z0000 Encounter for general adult medical examination without abnormal findings: Secondary | ICD-10-CM

## 2015-12-18 LAB — URINALYSIS, ROUTINE W REFLEX MICROSCOPIC
Bilirubin Urine: NEGATIVE
Hgb urine dipstick: NEGATIVE
Ketones, ur: NEGATIVE
LEUKOCYTES UA: NEGATIVE
Nitrite: NEGATIVE
Specific Gravity, Urine: 1.02 (ref 1.000–1.030)
TOTAL PROTEIN, URINE-UPE24: NEGATIVE
URINE GLUCOSE: NEGATIVE
Urobilinogen, UA: 0.2 (ref 0.0–1.0)
pH: 6 (ref 5.0–8.0)

## 2015-12-18 LAB — HEPATIC FUNCTION PANEL
ALBUMIN: 4.5 g/dL (ref 3.5–5.2)
ALT: 18 U/L (ref 0–35)
AST: 23 U/L (ref 0–37)
Alkaline Phosphatase: 68 U/L (ref 39–117)
BILIRUBIN TOTAL: 0.4 mg/dL (ref 0.2–1.2)
Bilirubin, Direct: 0.1 mg/dL (ref 0.0–0.3)
Total Protein: 7.2 g/dL (ref 6.0–8.3)

## 2015-12-18 LAB — CBC WITH DIFFERENTIAL/PLATELET
HEMATOCRIT: 38.9 % (ref 36.0–46.0)
Hemoglobin: 13 g/dL (ref 12.0–15.0)
MCHC: 33.3 g/dL (ref 30.0–36.0)
MCV: 85.2 fl (ref 78.0–100.0)
PLATELETS: 476 10*3/uL — AB (ref 150.0–400.0)
RBC: 4.57 Mil/uL (ref 3.87–5.11)
RDW: 13.6 % (ref 11.5–15.5)
WBC: 6.5 10*3/uL (ref 4.0–10.5)

## 2015-12-18 LAB — LIPID PANEL
CHOL/HDL RATIO: 2
Cholesterol: 170 mg/dL (ref 0–200)
HDL: 87.6 mg/dL (ref >39.00)
LDL Cholesterol: 74 mg/dL (ref 0–99)
NONHDL: 82.18
Triglycerides: 41 mg/dL (ref 0.0–149.0)
VLDL: 8.2 mg/dL (ref 0.0–40.0)

## 2015-12-18 LAB — BASIC METABOLIC PANEL
BUN: 14 mg/dL (ref 6–23)
CHLORIDE: 98 meq/L (ref 96–112)
CO2: 31 meq/L (ref 19–32)
Calcium: 9.4 mg/dL (ref 8.4–10.5)
Creatinine, Ser: 0.75 mg/dL (ref 0.40–1.20)
GFR: 83.13 mL/min (ref >60.00)
GLUCOSE: 92 mg/dL (ref 70–99)
Potassium: 3.9 mEq/L (ref 3.5–5.1)
Sodium: 135 mEq/L (ref 135–145)

## 2015-12-18 LAB — TSH: TSH: 2.48 u[IU]/mL (ref 0.35–4.50)

## 2015-12-18 NOTE — Progress Notes (Signed)
Pre visit review using our clinic review tool, if applicable. No additional management support is needed unless otherwise documented below in the visit note. 

## 2015-12-18 NOTE — Patient Instructions (Addendum)
Please complete labs on the way out. Check mychart over the next week for your test results. Schedule a fasting physical in 1 year.

## 2015-12-18 NOTE — Addendum Note (Signed)
Addended by: Mervin KungFERGERSON, Hammond Obeirne A on: 12/18/2015 09:34 AM   Modules accepted: Orders

## 2015-12-18 NOTE — Progress Notes (Signed)
Subjective:    Patient ID: Amy [name], female    DOB: 28-Apr-1951, 65 y.o.   MRN: [id number]  HPI  Patient presents today for complete physical.  Immunizations:  Immunizations up to date Colonoscopy:12/08 Dexa: 1/16 Pap Smear: 1/16- wants to do annually Mammogram:1/16    Review of Systems  Constitutional: Negative for unexpected weight change.  HENT: Negative for hearing loss and rhinorrhea.   Eyes: Negative for visual disturbance.  Respiratory: Negative for cough.   Cardiovascular: Negative for leg swelling.  Gastrointestinal: Negative for diarrhea, constipation and blood in stool.  Genitourinary: Negative for dysuria and frequency.  Musculoskeletal: Negative for myalgias and arthralgias.  Skin: Negative for rash.  Neurological: Negative for headaches.  Hematological: Negative for adenopathy.  Psychiatric/Behavioral:       Denies depression/anxiety   Past Medical History  Diagnosis Date  . Fracture     ankle fracture 5/11  . Osteopenia   . GERD (gastroesophageal reflux disease)   . Osteoporosis 12/22/2014    Social History   Social History  . Marital Status: Widowed    Spouse Name: N/A  . Number of Children: 2  . 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 Smoking status: Never Smoker   . Smokeless tobacco: Never Used  . Alcohol Use: No  . Drug Use: Not on file  . Sexual Activity: Not on file   Other Topics Concern  . Not on file   Social History Narrative   Regular exercise:  Walks daily   Caffeine use:  2 cups coffee daily   Widowed- husband died in 05/18/2011   She has 2 daughters   No grand children          Past Surgical History  Procedure Laterality Date  . Fracture surgery  05/11/2010    lft ankle ORIF  . Hernia repair  2008/05/17    umbilical hernia/bilat inguinal repair    Family History  Problem Relation Age of Onset  . Arthritis Sister   . Cancer Sister     uterine cancer      Allergies  Allergen Reactions  . Alendronate Sodium Other (See Comments)    Tightness in throat and chest.  . Codeine Nausea Only    Current Outpatient Prescriptions on File Prior to Visit  Medication Sig Dispense Refill  . Calcium Carbonate-Vitamin D 600-400 MG-UNIT per chew tablet Chew 1 tablet by mouth 2 (two) times daily.      . Coenzyme Q10 (CO Q-10 PO) Take 1 tablet by mouth daily.    . fish oil-omega-3 fatty acids 1000 MG capsule Take 1 g by mouth daily.    . Multiple Vitamins-Minerals (MULTIVITAMIN WITH MINERALS) tablet Take 1 tablet by mouth daily.     No current facility-administered medications on file prior to visit.    BP 115/68 mmHg  Pulse 71  Temp(Src) 97.5 F (36.4 C) (Oral)  Resp 16  Ht 5\' 3"  (1.6 m)  Wt 112 lb 9.6 oz (51.075 kg)  BMI 19.95 kg/m2  SpO2 100%       Objective:   Physical Exam  Physical Exam  Constitutional: She is oriented to person, place, and time. She appears well-developed and well-nourished. No distress.  HENT:  Head: Normocephalic and atraumatic.  Right Ear: Tympanic membrane and ear canal normal.  Left Ear: Tympanic membrane and ear canal normal.  Mouth/Throat: Oropharynx is clear and moist.  Eyes: Pupils  are equal, round, and reactive to light. No scleral icterus.  Neck: Normal range of motion. No thyromegaly present.  Cardiovascular: Normal rate and regular rhythm.   No murmur heard. Pulmonary/Chest: Effort normal and breath sounds normal. No respiratory distress. He has no wheezes. She has no rales. She exhibits no tenderness.  Abdominal: Soft. Bowel sounds are normal. He exhibits no distension and no mass. There is no tenderness. There is no rebound and no guarding.  Musculoskeletal: She exhibits no edema.  Lymphadenopathy:    She has no cervical adenopathy.  Neurological: She is alert and oriented to person, place, and time. She has normal patellar reflexes. She exhibits normal muscle tone. Coordination normal.  Skin:  Skin is warm and dry.  Psychiatric: She has a normal mood and affect. Her behavior is normal. Judgment and thought content normal.  Breasts: Examined lying, calcified bilateral breast implants noted which limit breast examination.  Focal soft, reducible bulge noted left implant at 4 oclock (previously evaluated by diagnostic mammo and felt secondary to implant) Inguinal/mons: Normal without inguinal adenopathy  External genitalia: Normal  BUS/Urethra/Skene's glands: Normal  Bladder: Normal  Vagina: Normal  Cervix: Normal  Uterus: normal in size, shape and contour. Midline and mobile  Adnexa/parametria:  Rt: Without masses or tenderness.  Lt: Without masses or tenderness.  Anus and perineum: Normal           Assessment & Plan:         Assessment & Plan:

## 2015-12-18 NOTE — Assessment & Plan Note (Addendum)
EKG tracing is personally reviewed and compared to previous EKG.   EKG notes NSR.  No acute changes.  Obtain routine lab work.  Pap performed. Pt to schedule mammogram.  Will need colo next year.

## 2015-12-19 ENCOUNTER — Encounter: Payer: Self-pay | Admitting: Family

## 2015-12-19 ENCOUNTER — Other Ambulatory Visit: Payer: Self-pay | Admitting: Family

## 2015-12-19 MED ORDER — ASPIRIN EC 81 MG PO TBEC: 81.0000 mg | DELAYED_RELEASE_TABLET | Freq: Every day | ORAL | Status: DC

## 2015-12-22 ENCOUNTER — Ambulatory Visit (HOSPITAL_BASED_OUTPATIENT_CLINIC_OR_DEPARTMENT_OTHER): Payer: Federal, State, Local not specified - PPO

## 2015-12-22 ENCOUNTER — Encounter: Payer: Self-pay | Admitting: Family

## 2015-12-22 LAB — CYTOLOGY - PAP

## 2015-12-24 ENCOUNTER — Ambulatory Visit (HOSPITAL_BASED_OUTPATIENT_CLINIC_OR_DEPARTMENT_OTHER): Payer: Federal, State, Local not specified - PPO

## 2015-12-25 ENCOUNTER — Ambulatory Visit (HOSPITAL_BASED_OUTPATIENT_CLINIC_OR_DEPARTMENT_OTHER)
Admission: RE | Admit: 2015-12-25 | Discharge: 2015-12-25 | Disposition: A | Discharge status: Home / Self Care | Payer: Federal, State, Local not specified - PPO | Source: Ambulatory Visit | Attending: Family | Admitting: Family

## 2015-12-25 DIAGNOSIS — Z0000 Encounter for general adult medical examination without abnormal findings: Secondary | ICD-10-CM

## 2015-12-25 DIAGNOSIS — Z1231 Encounter for screening mammogram for malignant neoplasm of breast: Secondary | ICD-10-CM | POA: Diagnosis present

## 2016-01-19 ENCOUNTER — Encounter (HOSPITAL_COMMUNITY): Payer: Self-pay | Admitting: Emergency Medicine

## 2016-01-19 ENCOUNTER — Emergency Department (HOSPITAL_COMMUNITY)
Admission: EM | Admit: 2016-01-19 | Discharge: 2016-01-19 | Disposition: A | Discharge status: Home / Self Care | Payer: Worker's Compensation | Attending: Emergency Medicine | Admitting: Emergency Medicine

## 2016-01-19 ENCOUNTER — Emergency Department (HOSPITAL_COMMUNITY): Payer: Worker's Compensation

## 2016-01-19 DIAGNOSIS — W010XXA Fall on same level from slipping, tripping and stumbling without subsequent striking against object, initial encounter: Secondary | ICD-10-CM | POA: Diagnosis not present

## 2016-01-19 DIAGNOSIS — Y9289 Other specified places as the place of occurrence of the external cause: Secondary | ICD-10-CM | POA: Insufficient documentation

## 2016-01-19 DIAGNOSIS — S2232XA Fracture of one rib, left side, initial encounter for closed fracture: Secondary | ICD-10-CM

## 2016-01-19 DIAGNOSIS — Z79899 Other long term (current) drug therapy: Secondary | ICD-10-CM | POA: Insufficient documentation

## 2016-01-19 DIAGNOSIS — Y9389 Activity, other specified: Secondary | ICD-10-CM | POA: Insufficient documentation

## 2016-01-19 DIAGNOSIS — K219 Gastro-esophageal reflux disease without esophagitis: Secondary | ICD-10-CM | POA: Diagnosis not present

## 2016-01-19 DIAGNOSIS — S2242XA Multiple fractures of ribs, left side, initial encounter for closed fracture: Secondary | ICD-10-CM | POA: Insufficient documentation

## 2016-01-19 DIAGNOSIS — S42292A Other displaced fracture of upper end of left humerus, initial encounter for closed fracture: Secondary | ICD-10-CM | POA: Diagnosis not present

## 2016-01-19 DIAGNOSIS — M810 Age-related osteoporosis without current pathological fracture: Secondary | ICD-10-CM | POA: Diagnosis not present

## 2016-01-19 DIAGNOSIS — S29001A Unspecified injury of muscle and tendon of front wall of thorax, initial encounter: Secondary | ICD-10-CM | POA: Diagnosis present

## 2016-01-19 DIAGNOSIS — Y990 Civilian activity done for income or pay: Secondary | ICD-10-CM | POA: Insufficient documentation

## 2016-01-19 DIAGNOSIS — M8580 Other specified disorders of bone density and structure, unspecified site: Secondary | ICD-10-CM | POA: Insufficient documentation

## 2016-01-19 DIAGNOSIS — S42202A Unspecified fracture of upper end of left humerus, initial encounter for closed fracture: Secondary | ICD-10-CM

## 2016-01-19 HISTORY — PX: FRACTURE SURGERY: SHX138

## 2016-01-19 MED ORDER — MORPHINE SULFATE (PF) 4 MG/ML IV SOLN
4.0000 mg | Freq: Once | INTRAVENOUS | Status: AC
  Administered 2016-01-19: 4 mg via INTRAVENOUS
  Filled 2016-01-19: qty 1

## 2016-01-19 MED ORDER — OXYCODONE-ACETAMINOPHEN 5-325 MG PO TABS: 1.0000 | ORAL_TABLET | ORAL | Status: DC | PRN

## 2016-01-19 MED FILL — OXYCODONE/APAP 5-325: 5-325 | 4 days supply | Qty: 20 | Fill #0

## 2016-01-19 NOTE — ED Notes (Signed)
Per [facility] pt had fall today co left shoulder and left wrist pain , also reports left ribs pain . Pt fell at work , received 250 mcg Fentanyl IV . Pt reports pain 2/10 at this time. Alert and oriented x 4, denies head injury nor loc. Vs 12/77 Hr 70, Spo2 95 RA.

## 2016-01-19 NOTE — Discharge Instructions (Signed)
Humerus Fracture Treated With Immobilization °The humerus is the large bone in your upper arm. You have a broken (fractured) humerus. These fractures are easily diagnosed with X-rays. °TREATMENT  °Simple fractures which will heal without disability are treated with simple immobilization. Immobilization means you will wear a cast, splint, or sling. You have a fracture which will do well with immobilization. The fracture will heal well simply by being held in a good position until it is stable enough to begin range of motion exercises. Do not take part in activities which would further injure your arm.  °HOME CARE INSTRUCTIONS  °· Put ice on the injured area. °¨ Put ice in a plastic bag. °¨ Place a towel between your skin and the bag. °¨ Leave the ice on for 15-20 minutes, 03-04 times a day. °· If you have a cast: °¨ Do not scratch the skin under the cast using sharp or pointed objects. °¨ Check the skin around the cast every day. You may put lotion on any red or sore areas. °¨ Keep your cast dry and clean. °· If you have a splint: °¨ Wear the splint as directed. °¨ Keep your splint dry and clean. °¨ You may loosen the elastic around the splint if your fingers become numb, tingle, or turn cold or blue. °· If you have a sling: °¨ Wear the sling as directed. °· Do not put pressure on any part of your cast or splint until it is fully hardened. °· Your cast or splint can be protected during bathing with a plastic bag. Do not lower the cast or splint into water. °· Only take over-the-counter or prescription medicines for pain, discomfort, or fever as directed by your caregiver. °· Do range of motion exercises as instructed by your caregiver. °· Follow up as directed by your caregiver. This is very important in order to avoid permanent injury or disability and chronic pain. °SEEK IMMEDIATE MEDICAL CARE IF:  °· Your skin or nails in the injured arm turn blue or gray. °· Your arm feels cold or numb. °· You develop severe pain  in the injured arm. °· You are having problems with the medicines you were given. °MAKE SURE YOU:  °· Understand these instructions. °· Will watch your condition. °· Will get help right away if you are not doing well or get worse. °  °This information is not intended to replace advice given to you by your health care provider. Make sure you discuss any questions you have with your health care provider. °  °Document Released: 03/06/2001 Document Revised: 12/19/2014 Document Reviewed: 04/22/2015 °Elsevier Interactive Patient Education ©2016 [employer] ° °

## 2016-01-19 NOTE — ED Notes (Signed)
Bed: [facility] Expected date:  Expected time:  Means of arrival:  Comments: EMS/65F/fall

## 2016-01-19 NOTE — ED Provider Notes (Signed)
CSN: [id number]     Arrival date & time 01/19/16  1231 History   First MD Initiated Contact with Patient 01/19/16 1253     Chief Complaint  Patient presents with  . Fall     HPI Patient presents to the emergency department after tripping and falling today and injuring her left shoulder and left ribs.  She states that she tripped and fell directly on the left shoulder.  She received 250 mcg of fentanyl and route which improved her pain.  She denies head injury.  She denies neck pain.  Denies abdominal pain.  No fevers or chills.   Past Medical History  Diagnosis Date  . Fracture     ankle fracture 5/11  . Osteopenia   . GERD (gastroesophageal reflux disease)   . Osteoporosis 12/22/2014   Past Surgical History  Procedure Laterality Date  . Fracture surgery  05/11/2010    lft ankle ORIF  . Hernia repair  2009    umbilical hernia/bilat inguinal repair   Family History  Problem Relation Age of Onset  . Arthritis Sister   . Cancer Sister     uterine cancer   Social History  Substance Use Topics  . Smoking status: Never Smoker   . Smokeless tobacco: Never Used  . Alcohol Use: No   OB History    No data available     Review of Systems  All other systems reviewed and are negative.     Allergies  Alendronate sodium and Codeine  Home Medications   Prior to Admission medications   Medication Sig Start Date End Date Taking? Authorizing Provider  B Complex Vitamins (B COMPLEX-B12 PO) Take 1 tablet by mouth daily.   Yes Historical Provider, MD  BIOTIN PO Take 1 tablet by mouth daily.   Yes Historical Provider, MD  Calcium Carbonate-Vitamin D 600-400 MG-UNIT per chew tablet Chew 1 tablet by mouth 2 (two) times daily.     Yes Historical Provider, MD  Coenzyme Q10 (CO Q-10 PO) Take 1 tablet by mouth daily.   Yes Historical Provider, MD  fish oil-omega-3 fatty acids 1000 MG capsule Take 1 g by mouth daily.   Yes Historical Provider, MD  Multiple Vitamins-Minerals (MULTIVITAMIN  WITH MINERALS) tablet Take 1 tablet by mouth daily.   Yes Historical Provider, MD  aspirin EC 81 MG tablet Take 1 tablet (81 mg total) by mouth daily. Patient not taking: Reported on 01/19/2016 12/19/15   [name], NP          BP 127/78 mmHg  Pulse 81  Temp(Src) 98.4 F (36.9 C) (Oral)  Resp 18  SpO2 99% Physical Exam  Constitutional: She is oriented to person, place, and time. She appears well-developed and well-nourished. No distress.  HENT:  Head: Normocephalic and atraumatic.  Eyes: EOM are normal.  Neck: Normal range of motion.  Cardiovascular: Normal rate and regular rhythm.   Pulmonary/Chest: Effort normal and breath sounds normal.  Tenderness left lateral chest  Abdominal: Soft. She exhibits no distension. There is no tenderness.  Musculoskeletal:  Pain with palpation of her proximal left humerus.  Normal range of motion of the left elbow and left wrist.  No tenderness of the left clavicle.  Neurological: She is alert and oriented to person, place, and time.  Skin: Skin is warm and dry.  Psychiatric: She has a normal mood and affect. Judgment normal.  Nursing note and vitals reviewed.   ED Course  Procedures (including critical care time) Labs Review Labs  Reviewed - No data to display  Imaging Review Dg Shoulder Left  01/19/2016  CLINICAL DATA:  Pain following fall EXAM: LEFT SHOULDER - 2+ VIEW COMPARISON:  None. FINDINGS: Frontal and Y scapular images were obtained. There is a comminuted fracture of the proximal humeral metaphysis with impaction as well as medial displacement of the humeral shaft with respect to the humeral head. Fracture extends to involve portions of the greater tuberosity and lateral humeral head region. No dislocation. There is evidence of old trauma involving the lateral left clavicle with widening of the acromioclavicular joint on the left. There are acute appearing fractures of the left anterior third and fourth ribs. No pneumothorax is  evident on somewhat limited evaluation of the chest. There is an apparent calcified breast implant on the left. IMPRESSION: Comminuted fracture proximal humeral metaphysis with involvement of the greater tuberosity and lateral humeral head. No dislocation. Evidence of old trauma involving the lateral left clavicle with widening of the acromioclavicular joint, likely chronic. Acute appearing fractures of the left anterior third and fourth ribs. These fractures appear mildly displaced. Advise chest radiograph to further evaluate. Electronically Signed   By: Bretta Bang III M.D.   On: 01/19/2016 13:56   I have personally reviewed and evaluated these images and lab results as part of my medical decision-making.   EKG Interpretation None      MDM   Final diagnoses:  Left rib fracture, closed, initial encounter  Closed fracture of left proximal humerus, initial encounter    Patient with acute left proximal humerus fracture and left sided rib fractures.  Bilateral breath sounds.  No hypoxia.  No indication for additional imaging her chest.  Patient treated with pain medication.  She'll follow-up with her with the surgery.  She understands to return the emergency department for new or worsening symptoms.  She has seen [facility] before in the past and will follow-up with them    Azalia Bilis, MD 01/19/16 1630

## 2016-02-23 MED FILL — CHLORHEXIDINE 0.12% RINSE: 0.12 | 8 days supply | Qty: 473 | Fill #0

## 2016-03-14 ENCOUNTER — Encounter: Payer: Self-pay | Admitting: Family

## 2016-03-14 ENCOUNTER — Ambulatory Visit (INDEPENDENT_AMBULATORY_CARE_PROVIDER_SITE_OTHER): Payer: Federal, State, Local not specified - PPO | Admitting: Family

## 2016-03-14 VITALS — BP 122/78 | HR 75 | Temp 98.5°F | Resp 16 | Ht 63.0 in | Wt 119.0 lb

## 2016-03-14 DIAGNOSIS — M810 Age-related osteoporosis without current pathological fracture: Secondary | ICD-10-CM | POA: Diagnosis not present

## 2016-03-14 NOTE — Progress Notes (Signed)
Pre visit review using our clinic review tool, if applicable. No additional management support is needed unless otherwise documented below in the visit note. 

## 2016-03-14 NOTE — Patient Instructions (Addendum)
Please consider starting prolia to help with your bones and decrease future fracture risk.  Let me know if you decide to proceed.

## 2016-03-14 NOTE — Progress Notes (Signed)
Subjective:    Patient ID: Amy [name], female    DOB: 05-27-1951, 66 y.o.   MRN: [id number]  HPI  Ms. Quesada is a 65 yr old female with hx of osteoporosis who presents today for follow up. Pt fell on 02/08/16 while at work. She presented to the ED following fall. ED record is reviewed.  X-ray revealed acute left proximal humerus fracture and left sided rib fractures.  She was referred back to [facility]. She ultimately underwent ORIF.  She is participating in PT and reports that she is healing well.    Review of Systems See HPI  Past Medical History  Diagnosis Date  . Fracture     ankle fracture 5/11  . Osteopenia   . GERD (gastroesophageal reflux disease)   . Osteoporosis 12/22/2014    Social History   Social History  . Marital Status: Widowed    Spouse Name: N/A  . Number of Children: 2  . 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 Smoking status: Never Smoker   . Smokeless tobacco: Never Used  . Alcohol Use: No  . Drug Use: Not on file  . Sexual Activity: Not on file   Other Topics Concern  . Not on file   Social History Narrative   Regular exercise:  Walks daily   Caffeine use:  2 cups coffee daily   Widowed- husband died in Apr 17, 2011   She has 2 daughters   No grand children          Past Surgical History  Procedure Laterality Date  . Fracture surgery  05/11/2010    lft ankle ORIF  . Hernia repair  Apr 16, 2008    umbilical hernia/bilat inguinal repair  . Fracture surgery Left 01/19/16    left arm fracture    Family History  Problem Relation Age of Onset  . Arthritis Sister   . Cancer Sister     uterine cancer    Allergies  Allergen Reactions  . Alendronate Sodium Other (See Comments)    Tightness in throat and chest.  . Codeine Nausea Only    Current Outpatient Prescriptions on File Prior to Visit  Medication Sig Dispense Refill  . aspirin EC 81 MG tablet Take 1 tablet (81  mg total) by mouth daily.    . B Complex Vitamins (B COMPLEX-B12 PO) Take 1 tablet by mouth daily.    Marland Kitchen BIOTIN PO Take 1 tablet by mouth daily.    . Calcium Carbonate-Vitamin D 600-400 MG-UNIT per chew tablet Chew 1 tablet by mouth 2 (two) times daily.      . Coenzyme Q10 (CO Q-10 PO) Take 1 tablet by mouth daily.    . fish oil-omega-3 fatty acids 1000 MG capsule Take 1 g by mouth daily.    . Multiple Vitamins-Minerals (MULTIVITAMIN WITH MINERALS) tablet Take 1 tablet by mouth daily.     No current facility-administered medications on file prior to visit.    BP 122/78 mmHg  Pulse 75  Temp(Src) 98.5 F (36.9 C) (Oral)  Resp 16  Ht  (1.6 m)  Wt 119 lb (53.978 kg)  BMI 21.09 kg/m2  SpO2 99%       Objective:   Physical Exam  Constitutional: She is oriented to person, place, and time. She appears well-developed and well-nourished.  HENT:  Head: Normocephalic and atraumatic.  Cardiovascular: Normal rate, regular rhythm and normal heart sounds.  No murmur heard. Pulmonary/Chest: Effort normal and breath sounds normal. No respiratory distress. She has no wheezes.  Neurological: She is alert and oriented to person, place, and time.  Psychiatric: She has a normal mood and affect. Her behavior is normal. Judgment and thought content normal.  Breast: Left breast implant, calcified.  No masses.  No bruising noted.        Assessment & Plan:  Osteoporosis-  Calculated 10 risk of hip fracture is 6.6%.  Her bone density has steadily worsened in the last 7 years. She is on calcium + D and plans to begin weight bearing exercises.  However I did spend about 15 minutes counseling patient on importance of osteoporosis treatment.  She will consider prolia and get back to me.

## 2016-03-15 DIAGNOSIS — S42292D Other displaced fracture of upper end of left humerus, subsequent encounter for fracture with routine healing: Secondary | ICD-10-CM | POA: Diagnosis not present

## 2016-03-15 DIAGNOSIS — M25512 Pain in left shoulder: Secondary | ICD-10-CM | POA: Diagnosis not present

## 2016-03-17 DIAGNOSIS — M25512 Pain in left shoulder: Secondary | ICD-10-CM | POA: Diagnosis not present

## 2016-03-17 DIAGNOSIS — S42292D Other displaced fracture of upper end of left humerus, subsequent encounter for fracture with routine healing: Secondary | ICD-10-CM | POA: Diagnosis not present

## 2016-03-18 ENCOUNTER — Telehealth: Payer: Self-pay | Admitting: Family

## 2016-03-18 NOTE — Telephone Encounter (Signed)
[facility] - Client TELEPHONE ADVICE RECORD   [facility]     [name] - Client    Client Site  [facility]    Physician [name]'Sullivan, Melissa     Contact Type Call    Who Is Calling Patient / Member / Family / Caregiver    Call Type Triage / Clinical    Relationship To Patient Self    Return Phone Number [phone]) [phone] (Primary)    Chief Complaint Breast Symptoms  Gender: Female Reason for Call Symptomatic / Request for Health Information  DOB: 1951-03-17  Initial Comment [name] states that she is having breast pain as a result of a fall.   Age: 89 Y 7 M 8 D Appointment Disposition EMR Appointment Attempted - Not Scheduled  Return Phone Number: [phone](336) [phone] (Primary), [phone]) [phone] (Secondary) Info pasted into Epic Yes  Address:  PreDisposition Call Doctor  City/State/Zip: [place]  Translation No    Nurse Assessment  Nurse: Thurmond ButtsWade, RN, Meriam SpragueBeverly Date/Time (Eastern Time): 03/18/2016 5:44:55 PM  Confirm and document reason for call. If symptomatic, describe symptoms. You must click the next button to save text entered. ---This call was taken at 406 pm . [name] state she had a fall on Feb 7th at work on concrete. She broke some ribs , her arm in 2 places, and bruised her left breast badly. Her breast is really hurting. She does have implants and she does feel different when she stands up. She is worried about it because a new pain started yesterday. She had seen Dr. Lendell CapriceSullivan on Tuesday, but she did not feel anything. She wants to see what she can have done to compare her breast now , with its previous state. The pains are sharp and intermittent and she rates as a 6-7 . This is concerning her because it is a new pain. She had to get up at 2 am and take advil due to not being able to sleep due to the breast pain. She does not have a fever or drainage.  Has the patient traveled  out of the country within the last 30 days? ---Not Applicable  Does the patient have any new or worsening symptoms? ---Yes  Will a triage be completed? ---Yes  Related visit to physician within the last 2 weeks? ---Yes  Does the PT have any chronic conditions? (i.e. diabetes, asthma, etc.) ---No  Is this a behavioral health or substance abuse call? ---No    Guidelines      Guideline Title Affirmed Question Affirmed Notes Nurse Date/Time Lamount Cohen(Eastern Time)  Breast Symptoms Breast lump  Ashley RoyaltyWade, RN, Beverly 03/18/2016 5:51:58 PM  Disp. Time Lamount Cohen(Eastern Time) Disposition Final User   03/18/2016 5:33:48 PM Send To RN Personal  Minna Antisando, Eric   03/18/2016 6:00:19 PM Paged On Call back to Call Center  Thurmond ButtsWade, RN, [place]    Reason: page Dr. Alphonsus SiasLetvak at 436 pm to see if he want her to go to ER tonight or ok to wait until Monday      03/18/2016 6:08:34 PM Send To RN Personal  Thurmond ButtsWade, RN, [facility]ly   03/18/2016 6:14:58 PM Send To RN Personal  Thurmond ButtsWade, RN, [facility]   03/18/2016 6:22:38 PM Attempt made - message left  Burress, RN, Misty StanleyLisa   03/18/2016 6:24:24 PM Call Completed  Burress, RN, Misty StanleyLisa   03/18/2016 5:53:13 PM See PCP When Office is Open (within 3 days)  Yes Thurmond Butts, RN, [facility] Understands: Yes   Disagree/Comply: Comply      Care Advice Given Per Guideline         SEE PCP WITHIN 3 DAYS: * You need to be seen within 2 or 3 days. Call your doctor during regular office hours and make an appointment. An urgent care center is often the best source of care if your doctor's office is closed or you can't get an appointment. NOTE: If office will be open tomorrow, tell caller to call then, not in 3 days. CALL BACK IF: * Fever or redness occurs * You become worse. CARE ADVICE given per Breast Symptoms (Adult) guideline.           Comments  User: Bradly Chris, RN Date/Time Lamount Cohen Time): 03/18/2016 5:54:51 PM  Told pt i am calling Dr. on call to see if he might want her to go in before Monday to be checked.  User: Bradly Chris, RN Date/Time Lamount Cohen Time): 03/18/2016 6:03:08 PM  Report given to Dr. Alphonsus Sias. He says to tell pt a possbile leaking implant is not an emergency unless there is terrible pain, then she would need to go to ER. Otherwise he will see her in saturday clinic and he asked we make her an appt. to see him tomorrow. She could also see her Dr. on Monday.  User: Bradly Chris, RN Date/Time Lamount Cohen Time): 03/18/2016 6:05:16 PM  Called pt back and told her the on call Dr. said a possble leaking implant is not an emergency unless there is terrible pain, then she would need to go to ER. Otherwise he will see her in the Saturday clinic- we will make her an appt and call her back. She does not have a preference . The earlier the better.  User: Darrol Poke, RN Date/Time Lamount Cohen Time): 03/18/2016 6:23:11 PM  left message for patient to call back to schedule appointment for tomorrow  User: Darrol Poke, RN Date/Time Lamount Cohen Time): 03/18/2016 8:06:50 PM  Reached patient and made appt for tomorrow at 10:15 am with Dr. Alphonsus Sias  Referrals   [facility]

## 2016-03-19 ENCOUNTER — Ambulatory Visit (INDEPENDENT_AMBULATORY_CARE_PROVIDER_SITE_OTHER): Payer: Federal, State, Local not specified - PPO | Admitting: Internal Medicine

## 2016-03-19 ENCOUNTER — Encounter: Payer: Self-pay | Admitting: Internal Medicine

## 2016-03-19 VITALS — BP 120/80 | HR 64 | Temp 97.8°F | Ht 63.0 in | Wt 119.0 lb

## 2016-03-19 DIAGNOSIS — N644 Mastodynia: Secondary | ICD-10-CM

## 2016-03-19 NOTE — Assessment & Plan Note (Signed)
Related to her fall 2 months ago Reassured--no worrisome findings or reasons for further imaging May be feeling pain still from the fractured ribs No change in current therapy recommended

## 2016-03-19 NOTE — Progress Notes (Signed)
Pre visit review using our clinic review tool, if applicable. No additional management support is needed unless otherwise documented below in the visit note. 

## 2016-03-19 NOTE — Progress Notes (Signed)
   Subjective:    Patient ID: Amy PopeNancy S [name], female    DOB: Apr 28, 1951, 65 y.o.   MRN: [id number]  HPI Here due to left breast pain  Larey SeatFell 2/7--- fractured humerus and 2 ribs Bad breast bruise that still hurts Plate and screws fixating left humerus  Still has open Worker's Comp case Rib pain is better--but she still feels them Feels something in her breasts--she has implants--- but ongoing pain  Using advil or tylenol but occasionally still needs the oxycodone (for all the pain)  Current Outpatient Prescriptions on File Prior to Visit  Medication Sig Dispense Refill  . aspirin EC 81 MG tablet Take 1 tablet (81 mg total) by mouth daily.    . B Complex Vitamins (B COMPLEX-B12 PO) Take 1 tablet by mouth daily.    Marland Kitchen. BIOTIN PO Take 1 tablet by mouth daily.    . Calcium Carbonate-Vitamin D 600-400 MG-UNIT per chew tablet Chew 1 tablet by mouth 2 (two) times daily.      . Coenzyme Q10 (CO Q-10 PO) Take 1 tablet by mouth daily.    . fish oil-omega-3 fatty acids 1000 MG capsule Take 1 g by mouth daily.    . Multiple Vitamins-Minerals (MULTIVITAMIN WITH MINERALS) tablet Take 1 tablet by mouth daily.     No current facility-administered medications on file prior to visit.    Allergies  Allergen Reactions  . Alendronate Sodium Other (See Comments)    Tightness in throat and chest.  . Codeine Nausea Only    Past Medical History  Diagnosis Date  . Fracture     ankle fracture 5/11  . Osteopenia   . GERD (gastroesophageal reflux disease)   . Osteoporosis 12/22/2014    Past Surgical History  Procedure Laterality Date  . Fracture surgery  05/11/2010    lft ankle ORIF  . Hernia repair  2009    umbilical hernia/bilat inguinal repair  . Fracture surgery Left 01/19/16    left arm fracture    Family History  Problem Relation Age of Onset  . Arthritis Sister   . Cancer Sister     uterine cancer    Social History   Social History  . Marital Status: Widowed    Spouse Name: N/A    . Number of Children: 2  . 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 Smoking status: Never Smoker   . Smokeless tobacco: Never Used  . Alcohol Use: No  . Drug Use: Not on file  . Sexual Activity: Not on file   Other Topics Concern  . Not on file   Social History Narrative   Regular exercise:  Walks daily   Caffeine use:  2 cups coffee daily   Widowed- husband died in 2012   She has 2 daughters   No grand children         Review of Systems  No fever Appetite is okay Doesn't feel sick     Objective:   Physical Exam  Genitourinary:  No axillary adenopathy Implant in left breast is intact No masses in breast or abnormal findings          Assessment & Plan:

## 2016-03-21 NOTE — Telephone Encounter (Signed)
Pt seen by Dr. Alphonsus SiasLetvak on 03/19/16.

## 2016-04-04 ENCOUNTER — Telehealth: Payer: Self-pay | Admitting: Family

## 2016-04-04 NOTE — Telephone Encounter (Signed)
Relation to ON:GEXBpt:self Call back number:[phone]   Reason for call:  Patient requesting office notes from 03/14/16 with Melissa and  03/19/2016 with Dr. Alphonsus SiasLetvak regarding her breast please mail to home.

## 2016-04-04 NOTE — Telephone Encounter (Signed)
Requested notes printed and mailed to pt. Pt has been notified.

## 2016-04-22 NOTE — Telephone Encounter (Signed)
Relation to ZO:XWRUpt:self Call back number: [phone]  Reason for call:  Patient states the letter received didn't reflect anything about her breast pain for the date of service 03/19/16. Patient would like office notes and would like to pick up note today. Please advise patient or leave VM informing patient office note is ready for pick up.

## 2016-04-22 NOTE — Telephone Encounter (Addendum)
Called patient.  Pt states Melissa's note does not mention anything about her breast pain and bruising on 03/14/16.  She wants to know can chart be updated to reflect the same.  Provider is out of the office today.  Pt would like a call back on Monday, 04/25/16 after 3 pm.    Please advise.

## 2016-04-25 NOTE — Telephone Encounter (Signed)
Notified pt. Pt upset that PCP note from April did not address more about her breast pain concern and did not understand why it was focused on her osteoporosis. Pt stated her breast pain became worse on Thursday and gradually worsened which prompted her visit to weekend clinic on 03/19/16.  Explained relation of osteoporosis / fractures and treatment to reduce risk of future fractures.

## 2016-04-25 NOTE — Telephone Encounter (Signed)
Please let pt know that the focus of that visit was her rib fractures and osteoporosis.  A breast exam was performed at that visit and is documented.  There was no visible breast bruising at the time of her visit.  I am unable to addend at this time.

## 2016-04-25 NOTE — Telephone Encounter (Signed)
Left message for pt to return my call.

## 2016-08-30 DIAGNOSIS — K080 Exfoliation of teeth due to systemic causes: Secondary | ICD-10-CM | POA: Diagnosis not present

## 2016-10-23 DIAGNOSIS — Z23 Encounter for immunization: Secondary | ICD-10-CM | POA: Diagnosis not present

## 2016-12-19 ENCOUNTER — Ambulatory Visit (INDEPENDENT_AMBULATORY_CARE_PROVIDER_SITE_OTHER): Payer: Federal, State, Local not specified - PPO | Admitting: Family

## 2016-12-19 ENCOUNTER — Other Ambulatory Visit (HOSPITAL_COMMUNITY)
Admission: RE | Admit: 2016-12-19 | Discharge: 2016-12-19 | Disposition: A | Discharge status: Home / Self Care | Payer: Federal, State, Local not specified - PPO | Source: Ambulatory Visit | Attending: Family | Admitting: Family

## 2016-12-19 ENCOUNTER — Encounter: Payer: Self-pay | Admitting: Family

## 2016-12-19 VITALS — BP 119/66 | HR 74 | Temp 98.5°F | Resp 16 | Ht 63.0 in | Wt 112.4 lb

## 2016-12-19 DIAGNOSIS — Z01419 Encounter for gynecological examination (general) (routine) without abnormal findings: Secondary | ICD-10-CM

## 2016-12-19 DIAGNOSIS — Z0000 Encounter for general adult medical examination without abnormal findings: Secondary | ICD-10-CM

## 2016-12-19 DIAGNOSIS — Z01411 Encounter for gynecological examination (general) (routine) with abnormal findings: Secondary | ICD-10-CM

## 2016-12-19 DIAGNOSIS — Z1151 Encounter for screening for human papillomavirus (HPV): Secondary | ICD-10-CM | POA: Insufficient documentation

## 2016-12-19 DIAGNOSIS — Z0001 Encounter for general adult medical examination with abnormal findings: Secondary | ICD-10-CM

## 2016-12-19 LAB — URINALYSIS, ROUTINE W REFLEX MICROSCOPIC
Bilirubin Urine: NEGATIVE
Hgb urine dipstick: NEGATIVE
KETONES UR: NEGATIVE
Leukocytes, UA: NEGATIVE
NITRITE: NEGATIVE
RBC / HPF: NONE SEEN (ref 0–?)
Specific Gravity, Urine: 1.01 (ref 1.000–1.030)
Total Protein, Urine: NEGATIVE
Urine Glucose: NEGATIVE
Urobilinogen, UA: 0.2 (ref 0.0–1.0)
WBC, UA: NONE SEEN (ref 0–?)
pH: 7.5 (ref 5.0–8.0)

## 2016-12-19 LAB — CBC WITH DIFFERENTIAL/PLATELET
BASOS PCT: 0.8 % (ref 0.0–3.0)
Basophils Absolute: 0.1 10*3/uL (ref 0.0–0.1)
EOS ABS: 0.1 10*3/uL (ref 0.0–0.7)
Eosinophils Relative: 1.6 % (ref 0.0–5.0)
HCT: 38.9 % (ref 36.0–46.0)
Hemoglobin: 13.4 g/dL (ref 12.0–15.0)
LYMPHS PCT: 22 % (ref 12.0–46.0)
Lymphs Abs: 1.5 10*3/uL (ref 0.7–4.0)
MCHC: 34.3 g/dL (ref 30.0–36.0)
MCV: 84.8 fl (ref 78.0–100.0)
Monocytes Absolute: 0.4 10*3/uL (ref 0.1–1.0)
Monocytes Relative: 6.3 % (ref 3.0–12.0)
NEUTROS ABS: 4.9 10*3/uL (ref 1.4–7.7)
Neutrophils Relative %: 69.3 % (ref 43.0–77.0)
PLATELETS: 435 10*3/uL — AB (ref 150.0–400.0)
RBC: 4.59 Mil/uL (ref 3.87–5.11)
RDW: 13.9 % (ref 11.5–15.5)
WBC: 7 10*3/uL (ref 4.0–10.5)

## 2016-12-19 LAB — LIPID PANEL
CHOLESTEROL: 173 mg/dL (ref 0–200)
HDL: 71.3 mg/dL (ref >39.00)
LDL Cholesterol: 83 mg/dL (ref 0–99)
NonHDL: 101.36
Total CHOL/HDL Ratio: 2
Triglycerides: 92 mg/dL (ref 0.0–149.0)
VLDL: 18.4 mg/dL (ref 0.0–40.0)

## 2016-12-19 LAB — BASIC METABOLIC PANEL
BUN: 11 mg/dL (ref 6–23)
CHLORIDE: 98 meq/L (ref 96–112)
CO2: 32 meq/L (ref 19–32)
Calcium: 9.4 mg/dL (ref 8.4–10.5)
Creatinine, Ser: 0.64 mg/dL (ref 0.40–1.20)
GFR: 99.5 mL/min (ref >60.00)
Glucose, Bld: 85 mg/dL (ref 70–99)
POTASSIUM: 4.3 meq/L (ref 3.5–5.1)
Sodium: 135 mEq/L (ref 135–145)

## 2016-12-19 LAB — HEPATIC FUNCTION PANEL
ALBUMIN: 4.3 g/dL (ref 3.5–5.2)
ALT: 20 U/L (ref 0–35)
AST: 27 U/L (ref 0–37)
Alkaline Phosphatase: 77 U/L (ref 39–117)
Bilirubin, Direct: 0.1 mg/dL (ref 0.0–0.3)
TOTAL PROTEIN: 7.3 g/dL (ref 6.0–8.3)
Total Bilirubin: 0.3 mg/dL (ref 0.2–1.2)

## 2016-12-19 LAB — TSH: TSH: 2.65 u[IU]/mL (ref 0.35–4.50)

## 2016-12-19 NOTE — Patient Instructions (Addendum)
You will be contacted about your referral to GYN. Please complete lab work prior to leaving. Continue healthy diet, exercise.

## 2016-12-19 NOTE — Progress Notes (Signed)
Subjective:    Patient ID: Amy [name], female    DOB: 04/17/1951, 66 y.o.   MRN: [id number]  HPI  Patient presents today for complete physical.  Immunizations: flu and tetanus up to date. Diet: healthy Exercise:walks every day.  Colonoscopy: 6/09 Dexa: 12/19/14 Pap Smear: 1/17 Mammogram: 12/25/15, due will schedule on her way out.     Review of Systems  Constitutional: Negative for unexpected weight change.  HENT: Negative for hearing loss and rhinorrhea.   Eyes: Negative for visual disturbance.  Respiratory: Negative for cough.   Cardiovascular: Negative for leg swelling.  Gastrointestinal: Negative for blood in stool, constipation and diarrhea.  Genitourinary: Negative for dysuria, frequency and hematuria.  Musculoskeletal: Negative for arthralgias and myalgias.  Skin: Negative for rash.  Neurological: Negative for headaches.  Hematological: Negative for adenopathy.  Psychiatric/Behavioral:       Denies depression/anxiety   Past Medical History:  Diagnosis Date  . Fracture    ankle fracture 5/11  . GERD (gastroesophageal reflux disease)   . Osteopenia   . Osteoporosis 12/22/2014     Social History   Social History  . Marital status: Widowed    Spouse name: N/A  . Number of children: 2  . Years of education: N/A   Occupational History  . CLERK [employer]   Social History Main Topics  . Smoking status: Never Smoker  . Smokeless tobacco: Never Used  . Alcohol use No  . Drug use: No  . Sexual activity: Not on file   Other Topics Concern  . Not on file   Social History Narrative   Regular exercise:  Walks daily   Caffeine use:  2 cups coffee daily   Widowed- husband died in 2011-05-16   She has 2 daughters   No grand children          Past Surgical History:  Procedure Laterality Date  . FRACTURE SURGERY  05/11/2010   lft ankle ORIF  . FRACTURE SURGERY Left 01/19/16   left arm fracture  . HERNIA REPAIR  15-May-2008   umbilical hernia/bilat inguinal  repair    Family History  Problem Relation Age of Onset  . Arthritis Sister   . Cancer Sister     uterine cancer    Allergies  Allergen Reactions  . Alendronate Sodium Other (See Comments)    Tightness in throat and chest.  . Codeine Nausea Only    Current Outpatient Prescriptions on File Prior to Visit  Medication Sig Dispense Refill  . aspirin EC 81 MG tablet Take 1 tablet (81 mg total) by mouth daily.    . B Complex Vitamins (B COMPLEX-B12 PO) Take 1 tablet by mouth daily.    Marland Kitchen BIOTIN PO Take 1 tablet by mouth daily.    . Calcium Carbonate-Vitamin D 600-400 MG-UNIT per chew tablet Chew 1 tablet by mouth 2 (two) times daily.      . Coenzyme Q10 (CO Q-10 PO) Take 1 tablet by mouth daily.    . fish oil-omega-3 fatty acids 1000 MG capsule Take 1 g by mouth daily.    . Multiple Vitamins-Minerals (MULTIVITAMIN WITH MINERALS) tablet Take 1 tablet by mouth daily.     No current facility-administered medications on file prior to visit.     BP 119/66 (BP Location: Right Arm, Cuff Size: Normal)   Pulse 74   Temp 98.5 F (36.9 C) (Oral)   Resp 16   Ht 5\' 3"  (1.6 m)   Wt 112 lb  6.4 oz (51 kg)   SpO2 100% Comment: room air  BMI 19.91 kg/m        Objective:   Physical Exam  Physical Exam  Constitutional: She is oriented to person, place, and time. She appears well-developed and well-nourished. No distress.  HENT:  Head: Normocephalic and atraumatic.  Right Ear: Tympanic membrane and ear canal normal.  Left Ear: Tympanic membrane and ear canal normal.  Mouth/Throat: Oropharynx is clear and moist.  Eyes: Pupils are equal, round, and reactive to light. No scleral icterus.  Neck: Normal range of motion. No thyromegaly present.  Cardiovascular: Normal rate and regular rhythm.   No murmur heard. Pulmonary/Chest: Effort normal and breath sounds normal. No respiratory distress. He has no wheezes. She has no rales. She exhibits no tenderness.  Abdominal: Soft. Bowel sounds are  normal. She exhibits no distension and no mass. There is no tenderness. There is no rebound and no guarding.  Musculoskeletal: She exhibits no edema.  Lymphadenopathy:    She has no cervical adenopathy.  Neurological: She is alert and oriented to person, place, and time. She has normal patellar reflexes. She exhibits normal muscle tone. Coordination normal.  Skin: Skin is warm and dry.  Psychiatric: She has a normal mood and affect. Her behavior is normal. Judgment and thought content normal.  Breasts: Examined lying. Very calcified breast implants bilaterally limiting exam  Inguinal/mons: Normal without inguinal adenopathy  External genitalia: Normal  BUS/Urethra/Skene's glands: Normal  Bladder: Normal  Vagina: Normal  Cervix: Normal (some pea sized firm lesions noted around base of cervix on manual exam) Uterus: normal in size, shape and contour. Midline and mobile  Adnexa/parametria:  Rt: Without masses or tenderness.  Lt: Without masses or tenderness.  Anus and perineum: Normal            Assessment & Plan:    Preventative Care- continue healthy diet, exercise. Obtain routine labs. Immunizations reviewed and up to date. Refer for mammogram. Pap performed today at patient request. EKG tracing is personally reviewed.  EKG notes NSR.  No acute changes.    Abnormal GYN exam- refer to GYN for further evaluation. Suspect small external fibroids.     Assessment & Plan:

## 2016-12-19 NOTE — Progress Notes (Signed)
Pre visit review using our clinic review tool, if applicable. No additional management support is needed unless otherwise documented below in the visit note. 

## 2016-12-19 NOTE — Addendum Note (Signed)
Addended by: Mervin KungFERGERSON, Jerrin Recore A on: 12/19/2016 08:37 AM   Modules accepted: Orders

## 2016-12-21 LAB — CYTOLOGY - PAP
Diagnosis: NEGATIVE
HPV: NOT DETECTED

## 2016-12-22 ENCOUNTER — Encounter: Payer: Self-pay | Admitting: Family

## 2016-12-26 ENCOUNTER — Inpatient Hospital Stay (HOSPITAL_BASED_OUTPATIENT_CLINIC_OR_DEPARTMENT_OTHER): Admission: RE | Admit: 2016-12-26 | Payer: Self-pay | Source: Ambulatory Visit

## 2016-12-29 ENCOUNTER — Inpatient Hospital Stay (HOSPITAL_BASED_OUTPATIENT_CLINIC_OR_DEPARTMENT_OTHER): Admission: RE | Admit: 2016-12-29 | Payer: Self-pay | Source: Ambulatory Visit

## 2017-01-02 ENCOUNTER — Ambulatory Visit (HOSPITAL_BASED_OUTPATIENT_CLINIC_OR_DEPARTMENT_OTHER)
Admission: RE | Admit: 2017-01-02 | Discharge: 2017-01-02 | Disposition: A | Discharge status: Home / Self Care | Payer: Federal, State, Local not specified - PPO | Source: Ambulatory Visit | Attending: Family | Admitting: Family

## 2017-01-02 ENCOUNTER — Other Ambulatory Visit: Payer: Self-pay | Admitting: Family

## 2017-01-02 DIAGNOSIS — Z1231 Encounter for screening mammogram for malignant neoplasm of breast: Secondary | ICD-10-CM | POA: Insufficient documentation

## 2017-01-02 DIAGNOSIS — Z0000 Encounter for general adult medical examination without abnormal findings: Secondary | ICD-10-CM | POA: Diagnosis not present

## 2017-02-03 ENCOUNTER — Ambulatory Visit (INDEPENDENT_AMBULATORY_CARE_PROVIDER_SITE_OTHER): Payer: Federal, State, Local not specified - PPO | Admitting: Obstetrics & Gynecology

## 2017-02-03 ENCOUNTER — Encounter: Payer: Self-pay | Admitting: Obstetrics & Gynecology

## 2017-02-03 VITALS — BP 112/59 | HR 69 | Ht 64.0 in | Wt 113.0 lb

## 2017-02-03 DIAGNOSIS — N889 Noninflammatory disorder of cervix uteri, unspecified: Secondary | ICD-10-CM | POA: Diagnosis not present

## 2017-02-03 NOTE — Patient Instructions (Signed)

## 2017-02-03 NOTE — Progress Notes (Signed)
History:  66 y.o. G2P2 here today for eval of lesions noted during her routine GYN done by her primary care physician. Pt denies GYN problems.  She is menopausal and denies bleeding.  Her last PAP 18/07/2017 WNL with neg hrHPV.    The following portions of the patient's history were reviewed and updated as appropriate: allergies, current medications, past family history, past medical history, past social history, past surgical history and problem list.  Review of Systems:  Pertinent items are noted in HPI.   Objective:  Physical Exam Blood pressure (!) 112/59, pulse 69, height 5\' 4"  (1.626 m), weight 113 lb (51.3 kg). BP (!) 112/59 (BP Location: Left Arm)   Pulse 69   Ht 5\' 4"  (1.626 m)   Wt 113 lb (51.3 kg)   BMI 19.40 kg/m  CONSTITUTIONAL: Well-developed, well-nourished female in no acute distress.  HENT:  Normocephalic, atraumatic EYES: Conjunctivae and EOM are normal. No scleral icterus.  NECK: Normal range of motion SKIN: Skin is warm and dry. No rash noted. Not diaphoretic.No pallor. NEUROLGIC: Alert and oriented to person, place, and time. Normal coordination.  Abd: Soft, nontender and nondistended Pelvic: Normal appearing external genitalia; normal appearing vaginal mucosa and cervix.  Normal discharge.  Small uterus, no other palpable masses, no uterine or adnexal tenderness   Assessment & Plan:  Cervical abnormality noted on primary care exam. Some atrophy noted o/w normal exam Discussed with pt current guidelines for PAPs vs GYN exam and mammograms rec f/u in 1 year or sooner prn  Total face-to-face time with patient was 20 min.  Greater than 50% was spent in counseling and coordination of care with the patient.     Tamsin Nader L. Harraway-Smith, M.D., [facility] CoreFACOG

## 2017-03-07 DIAGNOSIS — K080 Exfoliation of teeth due to systemic causes: Secondary | ICD-10-CM | POA: Diagnosis not present

## 2017-03-13 DIAGNOSIS — K080 Exfoliation of teeth due to systemic causes: Secondary | ICD-10-CM | POA: Diagnosis not present

## 2017-03-15 DIAGNOSIS — K080 Exfoliation of teeth due to systemic causes: Secondary | ICD-10-CM | POA: Diagnosis not present

## 2017-06-05 MED FILL — SULFAMETHOXAZOLE/TMP DS TAB: 800-160 | 7 days supply | Qty: 14 | Fill #0

## 2017-06-07 MED FILL — CLINDAMYCIN HCL 300 MG CAPS: 300 | 10 days supply | Qty: 20 | Fill #0

## 2017-08-03 DIAGNOSIS — K080 Exfoliation of teeth due to systemic causes: Secondary | ICD-10-CM | POA: Diagnosis not present

## 2017-09-07 DIAGNOSIS — K080 Exfoliation of teeth due to systemic causes: Secondary | ICD-10-CM | POA: Diagnosis not present

## 2017-12-22 ENCOUNTER — Ambulatory Visit (INDEPENDENT_AMBULATORY_CARE_PROVIDER_SITE_OTHER): Payer: Federal, State, Local not specified - PPO | Admitting: Family

## 2017-12-22 ENCOUNTER — Encounter: Payer: Self-pay | Admitting: Family

## 2017-12-22 VITALS — BP 109/68 | HR 70 | Temp 97.9°F | Resp 16 | Ht 63.0 in | Wt 116.2 lb

## 2017-12-22 DIAGNOSIS — Z0000 Encounter for general adult medical examination without abnormal findings: Secondary | ICD-10-CM | POA: Diagnosis not present

## 2017-12-22 DIAGNOSIS — M8580 Other specified disorders of bone density and structure, unspecified site: Secondary | ICD-10-CM

## 2017-12-22 LAB — LIPID PANEL
CHOL/HDL RATIO: 2
Cholesterol: 158 mg/dL (ref 0–200)
HDL: 79.9 mg/dL (ref >39.00)
LDL CALC: 68 mg/dL (ref 0–99)
NonHDL: 78.41
Triglycerides: 52 mg/dL (ref 0.0–149.0)
VLDL: 10.4 mg/dL (ref 0.0–40.0)

## 2017-12-22 LAB — TSH: TSH: 2.12 u[IU]/mL (ref 0.35–4.50)

## 2017-12-22 LAB — URINALYSIS, ROUTINE W REFLEX MICROSCOPIC
Bilirubin Urine: NEGATIVE
HGB URINE DIPSTICK: NEGATIVE
Ketones, ur: NEGATIVE
Leukocytes, UA: NEGATIVE
Nitrite: NEGATIVE
SPECIFIC GRAVITY, URINE: 1.01 (ref 1.000–1.030)
Total Protein, Urine: NEGATIVE
Urine Glucose: NEGATIVE
Urobilinogen, UA: 0.2 (ref 0.0–1.0)
pH: 7.5 (ref 5.0–8.0)

## 2017-12-22 LAB — CBC WITH DIFFERENTIAL/PLATELET
BASOS PCT: 1 % (ref 0.0–3.0)
Basophils Absolute: 0.1 10*3/uL (ref 0.0–0.1)
EOS ABS: 0.1 10*3/uL (ref 0.0–0.7)
Eosinophils Relative: 2.5 % (ref 0.0–5.0)
HCT: 39.4 % (ref 36.0–46.0)
HEMOGLOBIN: 13.1 g/dL (ref 12.0–15.0)
LYMPHS ABS: 1.6 10*3/uL (ref 0.7–4.0)
Lymphocytes Relative: 28.8 % (ref 12.0–46.0)
MCHC: 33.2 g/dL (ref 30.0–36.0)
MCV: 87 fl (ref 78.0–100.0)
MONO ABS: 0.4 10*3/uL (ref 0.1–1.0)
Monocytes Relative: 7.4 % (ref 3.0–12.0)
NEUTROS PCT: 60.3 % (ref 43.0–77.0)
Neutro Abs: 3.5 10*3/uL (ref 1.4–7.7)
Platelets: 452 10*3/uL — ABNORMAL HIGH (ref 150.0–400.0)
RBC: 4.53 Mil/uL (ref 3.87–5.11)
RDW: 13.8 % (ref 11.5–15.5)
WBC: 5.7 10*3/uL (ref 4.0–10.5)

## 2017-12-22 LAB — HEPATIC FUNCTION PANEL
ALT: 21 U/L (ref 0–35)
AST: 25 U/L (ref 0–37)
Albumin: 4.4 g/dL (ref 3.5–5.2)
Alkaline Phosphatase: 62 U/L (ref 39–117)
BILIRUBIN TOTAL: 0.5 mg/dL (ref 0.2–1.2)
Bilirubin, Direct: 0.1 mg/dL (ref 0.0–0.3)
Total Protein: 7.2 g/dL (ref 6.0–8.3)

## 2017-12-22 LAB — BASIC METABOLIC PANEL
BUN: 14 mg/dL (ref 6–23)
CHLORIDE: 99 meq/L (ref 96–112)
CO2: 33 mEq/L — ABNORMAL HIGH (ref 19–32)
CREATININE: 0.64 mg/dL (ref 0.40–1.20)
Calcium: 9.5 mg/dL (ref 8.4–10.5)
GFR: 99.18 mL/min (ref >60.00)
Glucose, Bld: 89 mg/dL (ref 70–99)
Potassium: 4.5 mEq/L (ref 3.5–5.1)
Sodium: 135 mEq/L (ref 135–145)

## 2017-12-22 NOTE — Progress Notes (Signed)
Subjective:    Patient ID: Riesa PopeNancy S Roemer, female    DOB: 01/13/1951, 67 y.o.   MRN: [id number]  HPI  Patient presents today for complete physical.  Immunizations: tdap 2010, flu shot up to date Diet: reports healthy diet Exercise: enjoys walking Colonoscopy: 6/09-  Dexa:1/16 Pap Smear: 12/19/16 Mammogram: 01/02/17 Vision: has apt on Monday Dental:  Goes every 6 months Wt Readings from Last 3 Encounters:  12/22/17 116 lb 3.2 oz (52.7 kg)  02/03/17 113 lb (51.3 kg)  12/19/16 112 lb 6.4 oz (51 kg)        Review of Systems  Constitutional: Negative for unexpected weight change.  HENT: Negative for hearing loss and rhinorrhea.   Eyes: Negative for visual disturbance.  Respiratory: Negative for cough.   Cardiovascular: Negative for leg swelling.  Gastrointestinal: Negative for constipation and diarrhea.  Genitourinary: Negative for dysuria, frequency and hematuria.  Musculoskeletal: Negative for arthralgias and myalgias.  Skin: Negative for rash.  Hematological: Negative for adenopathy.  Psychiatric/Behavioral:       Denies depression/anxiety       Past Medical History:  Diagnosis Date  . Fracture    ankle fracture 5/11  . GERD (gastroesophageal reflux disease)   . Osteopenia   . Osteoporosis 12/22/2014     Social History   Socioeconomic History  . Marital status: Widowed    Spouse name: Not on file  . Number of children: 2  . Years of education: Not on file  . Highest education level: Not on file  Social Needs  . Financial resource strain: Not on file  . Food insecurity - worry: Not on file  . Food insecurity - inability: Not on file  . Transportation needs - medical: Not on file  . Transportation needs - non-medical: Not on file  Occupational History  . Occupation: Marine scientistCLERK    Employer: [employer]  Tobacco Use  . Smoking status: Never Smoker  . Smokeless tobacco: Never Used  Substance and Sexual Activity  . Alcohol use: No  . Drug use: No  .  Sexual activity: Not on file  Other Topics Concern  . Not on file  Social History Narrative   Regular exercise:  Walks daily   Caffeine use:  2 cups coffee daily   Widowed- husband died in 2012   She has 2 daughters   No grand children       Past Surgical History:  Procedure Laterality Date  . FRACTURE SURGERY  05/11/2010   lft ankle ORIF  . FRACTURE SURGERY Left 01/19/16   left arm fracture  . HERNIA REPAIR  2009   umbilical hernia/bilat inguinal repair    Family History  Problem Relation Age of Onset  . Arthritis Sister   . Cancer Sister        uterine cancer    Allergies  Allergen Reactions  . Alendronate Sodium Other (See Comments)    Tightness in throat and chest.  . Codeine Nausea Only    Current Outpatient Medications on File Prior to Visit  Medication Sig Dispense Refill  . B Complex Vitamins (B COMPLEX-B12 PO) Take 1 tablet by mouth daily.    Marland Kitchen. BIOTIN PO Take 1 tablet by mouth daily.    . Calcium Carbonate-Vitamin D 600-400 MG-UNIT per chew tablet Chew 1 tablet by mouth 2 (two) times daily.      . Coenzyme Q10 (CO Q-10 PO) Take 1 tablet by mouth daily.    . fish oil-omega-3 fatty acids 1000  MG capsule Take 1 g by mouth daily.    . Multiple Vitamins-Minerals (MULTIVITAMIN WITH MINERALS) tablet Take 1 tablet by mouth daily.     No current facility-administered medications on file prior to visit.     BP 109/68 (BP Location: Right Arm, Cuff Size: Normal)   Pulse 70   Temp 97.9 F (36.6 C) (Oral)   Resp 16   Ht 5\' 3"  (1.6 m)   Wt 116 lb 3.2 oz (52.7 kg)   SpO2 100%   BMI 20.58 kg/m     Objective:   Physical Exam  Physical Exam  Constitutional: She is oriented to person, place, and time. She appears well-developed and well-nourished. No distress.  HENT:  Head: Normocephalic and atraumatic.  Right Ear: Tympanic membrane and ear canal normal.  Left Ear: Tympanic membrane and ear canal normal.  Mouth/Throat: Oropharynx is clear and moist.  Eyes:  Pupils are equal, round, and reactive to light. No scleral icterus.  Neck: Normal range of motion. No thyromegaly present.  Cardiovascular: Normal rate and regular rhythm.   No murmur heard. Pulmonary/Chest: Effort normal and breath sounds normal. No respiratory distress. He has no wheezes. She has no rales. She exhibits no tenderness.  Abdominal: Soft. Bowel sounds are normal. She exhibits no distension and no mass. There is no tenderness. There is no rebound and no guarding.  Musculoskeletal: She exhibits no edema.  Lymphadenopathy:    She has no cervical adenopathy.  Neurological: She is alert and oriented to person, place, and time. She has normal patellar reflexes. She exhibits normal muscle tone. Coordination normal.  Skin: Skin is warm and dry.  Psychiatric: She has a normal mood and affect. Her behavior is normal. Judgment and thought content normal.  Breasts: Examined lying- bilateral breast implants- heavily calcified limiting exam.  Though no obvious masses noted.       Assessment & Plan:         Assessment & Plan:  Preventative Care- encouraged pt to continue healthy diet and regular exercise. EKG tracing is personally reviewed.  EKG notes NSR.  No acute changes.  Refer for mammogram, dexa, she will return in 1 week for shingrix #1- we are out of stock today.

## 2017-12-22 NOTE — Patient Instructions (Addendum)
Continue healthy diet and regular exercise.  Complete lab work prior to leaving. Schedule mammogram and bone density on the first floor.

## 2017-12-25 DIAGNOSIS — K080 Exfoliation of teeth due to systemic causes: Secondary | ICD-10-CM | POA: Diagnosis not present

## 2017-12-27 ENCOUNTER — Encounter: Payer: Self-pay | Admitting: Internal Medicine

## 2017-12-29 ENCOUNTER — Ambulatory Visit (INDEPENDENT_AMBULATORY_CARE_PROVIDER_SITE_OTHER): Payer: Federal, State, Local not specified - PPO

## 2017-12-29 DIAGNOSIS — Z23 Encounter for immunization: Secondary | ICD-10-CM | POA: Diagnosis not present

## 2018-02-07 DIAGNOSIS — K080 Exfoliation of teeth due to systemic causes: Secondary | ICD-10-CM | POA: Diagnosis not present

## 2018-03-13 DIAGNOSIS — K080 Exfoliation of teeth due to systemic causes: Secondary | ICD-10-CM | POA: Diagnosis not present

## 2018-03-15 ENCOUNTER — Ambulatory Visit: Payer: Federal, State, Local not specified - PPO

## 2018-03-20 ENCOUNTER — Telehealth: Payer: Self-pay | Admitting: Family

## 2018-03-20 ENCOUNTER — Ambulatory Visit (HOSPITAL_BASED_OUTPATIENT_CLINIC_OR_DEPARTMENT_OTHER)
Admission: RE | Admit: 2018-03-20 | Discharge: 2018-03-20 | Disposition: A | Discharge status: Home / Self Care | Payer: Federal, State, Local not specified - PPO | Source: Ambulatory Visit | Attending: Family | Admitting: Family

## 2018-03-20 ENCOUNTER — Encounter (HOSPITAL_BASED_OUTPATIENT_CLINIC_OR_DEPARTMENT_OTHER): Payer: Self-pay

## 2018-03-20 ENCOUNTER — Ambulatory Visit (INDEPENDENT_AMBULATORY_CARE_PROVIDER_SITE_OTHER): Payer: Federal, State, Local not specified - PPO | Admitting: *Deleted

## 2018-03-20 DIAGNOSIS — Z1231 Encounter for screening mammogram for malignant neoplasm of breast: Secondary | ICD-10-CM | POA: Diagnosis not present

## 2018-03-20 DIAGNOSIS — Z9882 Breast implant status: Secondary | ICD-10-CM | POA: Diagnosis not present

## 2018-03-20 DIAGNOSIS — Z780 Asymptomatic menopausal state: Secondary | ICD-10-CM | POA: Diagnosis not present

## 2018-03-20 DIAGNOSIS — Z0000 Encounter for general adult medical examination without abnormal findings: Secondary | ICD-10-CM

## 2018-03-20 DIAGNOSIS — Z23 Encounter for immunization: Secondary | ICD-10-CM | POA: Diagnosis not present

## 2018-03-20 DIAGNOSIS — M8580 Other specified disorders of bone density and structure, unspecified site: Secondary | ICD-10-CM

## 2018-03-20 DIAGNOSIS — M810 Age-related osteoporosis without current pathological fracture: Secondary | ICD-10-CM | POA: Diagnosis not present

## 2018-03-20 NOTE — Telephone Encounter (Signed)
Bone density shows worsening osteoporosis despite exercise and calcium supplementation.  Last time we spoke she did not wish to pursue treatment. I would strongly suggest either trial of fosamax or prolia injection due to her high fracture risk. Please let me know if she is willing to proceed.

## 2018-03-20 NOTE — Progress Notes (Signed)
Reviewed. ° ° °Caden Fatica S O'Sullivan NP °

## 2018-03-20 NOTE — Progress Notes (Signed)
Pre visit review using our clinic review tool, if applicable. No additional management support is needed unless otherwise documented below in the visit note.  Pt here for 2nd shingrix vaccine per Sandford CrazeMelissa O'Sullivan, NP  Shingrix 0.645mL given IM right deltoid and pt tolerated injection well.

## 2018-03-21 NOTE — Telephone Encounter (Signed)
Attempted to reach pt and left message to check mychart. Message sent. 

## 2018-04-16 ENCOUNTER — Telehealth: Payer: Self-pay | Admitting: Family

## 2018-04-16 NOTE — Telephone Encounter (Signed)
Could you please contact pt and see if she is willing to try medication? If you are unable to reach her, let's send her a letter please.

## 2018-04-16 NOTE — Telephone Encounter (Signed)
See 03/20/18 phone note. PCP recommended either Fosamax or Prolia. Left detailed message on home # to call and let us know which option she would like to try.

## 2018-08-15 DIAGNOSIS — K080 Exfoliation of teeth due to systemic causes: Secondary | ICD-10-CM | POA: Diagnosis not present

## 2018-09-28 ENCOUNTER — Encounter: Payer: Self-pay | Admitting: Family

## 2018-10-31 DIAGNOSIS — K080 Exfoliation of teeth due to systemic causes: Secondary | ICD-10-CM | POA: Diagnosis not present

## 2019-03-22 ENCOUNTER — Encounter: Payer: Self-pay | Admitting: Family

## 2019-04-08 ENCOUNTER — Encounter: Payer: Self-pay | Admitting: Family

## 2019-05-13 ENCOUNTER — Other Ambulatory Visit (HOSPITAL_BASED_OUTPATIENT_CLINIC_OR_DEPARTMENT_OTHER): Payer: Self-pay | Admitting: Family

## 2019-05-13 DIAGNOSIS — Z1231 Encounter for screening mammogram for malignant neoplasm of breast: Secondary | ICD-10-CM

## 2019-05-17 ENCOUNTER — Other Ambulatory Visit: Payer: Self-pay

## 2019-05-17 ENCOUNTER — Ambulatory Visit (HOSPITAL_BASED_OUTPATIENT_CLINIC_OR_DEPARTMENT_OTHER)
Admission: RE | Admit: 2019-05-17 | Discharge: 2019-05-17 | Disposition: A | Discharge status: Home / Self Care | Payer: Federal, State, Local not specified - PPO | Source: Ambulatory Visit | Attending: Family | Admitting: Family

## 2019-05-17 DIAGNOSIS — Z1231 Encounter for screening mammogram for malignant neoplasm of breast: Secondary | ICD-10-CM | POA: Insufficient documentation

## 2019-06-17 ENCOUNTER — Encounter: Payer: Self-pay | Admitting: Family

## 2019-06-26 ENCOUNTER — Ambulatory Visit: Payer: Federal, State, Local not specified - PPO | Admitting: Obstetrics & Gynecology

## 2019-06-28 ENCOUNTER — Encounter: Payer: Self-pay | Admitting: Obstetrics & Gynecology

## 2019-06-28 ENCOUNTER — Other Ambulatory Visit: Payer: Self-pay

## 2019-06-28 ENCOUNTER — Ambulatory Visit (INDEPENDENT_AMBULATORY_CARE_PROVIDER_SITE_OTHER): Payer: Federal, State, Local not specified - PPO | Admitting: Obstetrics & Gynecology

## 2019-06-28 VITALS — BP 104/72 | HR 68 | Ht 64.0 in | Wt 118.0 lb

## 2019-06-28 DIAGNOSIS — Z1151 Encounter for screening for human papillomavirus (HPV): Secondary | ICD-10-CM | POA: Diagnosis not present

## 2019-06-28 DIAGNOSIS — Z01419 Encounter for gynecological examination (general) (routine) without abnormal findings: Secondary | ICD-10-CM

## 2019-06-28 DIAGNOSIS — M810 Age-related osteoporosis without current pathological fracture: Secondary | ICD-10-CM

## 2019-06-28 DIAGNOSIS — Z124 Encounter for screening for malignant neoplasm of cervix: Secondary | ICD-10-CM | POA: Diagnosis not present

## 2019-06-28 DIAGNOSIS — Z1211 Encounter for screening for malignant neoplasm of colon: Secondary | ICD-10-CM

## 2019-06-28 NOTE — Patient Instructions (Signed)
Exercise Information for Aging Adults Staying physically active is important as you age. The four types of exercises that are best for older adults are endurance, strength, balance, and flexibility. Contact your health care provider before you start any exercise routine. Ask your health care provider what activities are safe for you. What are the risks? Risks associated with exercising include:  Overdoing it. This may lead to sore muscles or fatigue.  Falls.  Injuries.  Dehydration. How to do these exercises Endurance exercises Endurance (aerobic) exercises raise your breathing rate and heart rate. Increasing your endurance helps you to do everyday tasks and stay healthy. By improving the health of your body system that includes your heart, lungs, and blood vessels (circulatory system), you may also delay or prevent diseases such as heart disease, diabetes, and bone loss (osteoporosis). Types of endurance exercises include:  Sports.  Indoor activities, such as using gym equipment, doing water aerobics, or dancing.  Outdoor activities, such as biking or jogging.  Tasks around the house, such as gardening, yard work, and heavy household chores like cleaning.  Walking, such as hiking or walking around your neighborhood. When doing endurance exercises, make sure you:  Are aware of your surroundings.  Use safety equipment as directed.  Dress in layers when exercising outdoors.  Drink plenty of water to stay well hydrated. Build up endurance slowly. Start with 10 minutes at a time, and gradually build up to doing 30 minutes at a time. Unless your health care provider gave you different instructions, aim to exercise for a total of 150 minutes a week. Spread out that time so you are working on endurance on 3 or more days a week. Strength exercises Lifting, pulling, or pushing weights helps to strengthen muscles. Having stronger muscles makes it easier to do everyday activities, such as  getting up from a chair, climbing stairs, carrying groceries, and playing with grandchildren. Strength exercises include arm and leg exercises that may be done:  With weights.  Without weights (using your own body weight).  With a resistance band. When doing strength exercises:  Move smoothly and steadily. Do not suddenly thrust or jerk the weights, the resistance band, or your body.  Start with no weights or with light weights, and gradually add more weight over time. Eventually, aim to use weights that are hard or very hard for you to lift. This means that you are able to do 8 repetitions with the weight, and the last few repetitions are very challenging.  Lift or push weights into position for 3 seconds, hold the position for 1 second, and then take 3 seconds to return to your starting position.  Breathe out (exhale) during difficult movements, like lifting or pushing weights. Breathe in (inhale) to relax your muscles before the next repetition.  Consider alternating arms or legs, especially when you first start strength exercises.  Expect some slight muscle soreness after each session. Do strength exercises on 2 or more days a week, for 30 minutes at a time. Avoid exercising the same muscle groups two days in a row. For example, if you work on your leg muscles one day, work on your arm muscles the next day. When you can do two sets of 10-15 repetitions with a certain weight, increase the amount of weight. Balance Balance exercises can help to prevent falls. Balance exercises include:  Standing on one foot.  Heel-to-toe walk.  Balance walk.  Tai chi. Make sure you have something sturdy to hold onto while doing  balance exercises, such as a sturdy chair. As your balance improves, challenge yourself by holding onto the chair with one hand instead of two, and then with no hands. Trying exercises with your eyes closed also challenges your balance, but be sure to have a sturdy surface  (like a countertop) close by in case you need it. Do balance exercises as often as you want, or as often as directed by your health care provider. Strength exercises for the lower body also help to improve balance. Flexibility  Flexibility exercises improve how far you can bend, straighten, move, or rotate parts of your body (range of motion). These exercises also help you to do everyday activities such as getting dressed or reaching for objects. Flexibility exercises include stretching different parts of the body, and they may be done in a standing or seated position or on the floor. When stretching, make sure you:  Keep a slight bend in your arms and legs. Avoid completely straightening ("locking") your joints.  Do not stretch so far that you feel pain. You should feel a mild stretching feeling. You may try stretching farther as you become more flexible over time.  Relax and breathe between stretches.  Hold onto something sturdy for balance as needed. Hold each stretch for 10-30 seconds. Repeat each stretch 3-5 times. General safety tips  Exercise in well-lit areas.  Do not hold your breath during exercises or stretches.  Warm up before exercising, and cool down after exercising. This can help prevent injury.  Drink plenty of water during exercise or any activity that makes you sweat.  Use smooth, steady movements. Do not use sudden, jerking movements, especially when lifting weights or doing flexibility exercises.  If you are not sure if an exercise is safe for you, or you are not sure how to do an exercise, talk with your health care provider. This is especially important if you have had surgery on muscles, bones, or joints (orthopedic surgery). Where to find more information You can find more information about exercise for older adults from:  Your local health department, fitness center, or community center. These facilities may have programs for aging adults.  [employer] on Aging: http://kim-miller.com/  National Council on Aging: www.ncoa.org Summary  Staying physically active is important as you age.  Make sure to contact your health care provider before you start any exercise routine. Ask your health care provider what activities are safe for you.  Doing endurance, strength, balance, and flexibility exercises can help to delay or prevent certain diseases, such as heart disease, diabetes, and bone loss (osteoporosis). This information is not intended to replace advice given to you by your health care provider. Make sure you discuss any questions you have with your health care provider. Document Released: 04/19/2017 Document Revised: 09/20/2018 Document Reviewed: 04/19/2017 Elsevier Patient Education  2020 [employer].

## 2019-06-28 NOTE — Progress Notes (Signed)
Subjective:     Amy [name] is a 68 y.o. female here for a routine exam.  Current complaints: none. Pt grows flowers as a hobby. Works at the [employer]. Doing well. No GYN problems.  Pt has a h/o osteoporosis. She does not want to take meds. She believes it comes from her father side of the family.  She walks 4-5 miles per day.    Gynecologic History No LMP recorded. Patient is postmenopausal. Contraception: post menopausal status Last Pap: 12/19/2016. Results were: normal Last mammogram: 05/17/2019. Results were: normal. implants noted  Obstetric History OB History  Gravida Para Term Preterm AB Living  2 2          SAB TAB Ectopic Multiple Live Births          2    # Outcome Date GA Lbr Len/2nd Weight Sex Delivery Anes PTL Lv  2 Para      Vag-Spont     1 Para      Vag-Spont      The following portions of the patient's history were reviewed and updated as appropriate: allergies, current medications, past family history, past medical history, past social history, past surgical history and problem list.  Review of Systems Pertinent items are noted in HPI.    Objective:  BP 104/72   Pulse 68   Ht 5\' 4"  (1.626 m)   Wt 118 lb (53.5 kg)   BMI 20.25 kg/m   General Appearance:    Alert, cooperative, no distress, appears stated age  Head:    Normocephalic, without obvious abnormality, atraumatic  Eyes:    conjunctiva/corneas clear, EOM's intact, both eyes  Ears:    Normal external ear canals, both ears  Nose:   Nares normal, septum midline, mucosa normal, no drainage    or sinus tenderness  Throat:   Lips, mucosa, and tongue normal; teeth and gums normal  Neck:   Supple, symmetrical, trachea midline, no adenopathy;    thyroid:  no enlargement/tenderness/nodules  Back:     Symmetric, no curvature, ROM normal, no CVA tenderness  Lungs:     respirations unlabored  Chest Wall:    No tenderness or deformity   Heart:    Regular rate and rhythm  Breast Exam:    No tenderness, masses,  or nipple abnormality  Abdomen:     Soft, non-tender, bowel sounds active all four quadrants,    no masses, no organomegaly  Genitalia:    Normal female without lesion, discharge or tenderness     Extremities:   Extremities normal, atraumatic, no cyanosis or edema  Pulses:   2+ and symmetric all extremities  Skin:   Skin color, texture, turgor normal, no rashes or lesions   03/20/2018 ASSESSMENT: The BMD measured at Femur Neck Left is 0.623 g/cm2 with a T-score of -3.0. This patient is considered osteoporotic according to World Health Organization [facility](WHO) criteria.  Site Region Measured Date Measured Age WHO YA BMD Classification T-score AP Spine L1-L4 5/20/2018 64.6 Osteoporosis -2.8 0.846 g/cm2  DualFemur Neck Left 5/20/2018 64.6 years Osteoporosis -3.0 0.623 g/cm2  World Health Organization [facility](WHO) criteria for post-menopausal, Caucasian Women: Normal       T-score at or above -1 SD Osteopenia   T-score between -1 and -2.5 SD Osteoporosis T-score at or below -2.5 SD Assessment:    Healthy female exam.   Osteoporosis- pt declines treatment. Will set up appt to review all management options.     Plan:   Amy [name]  was seen today for gynecologic exam.  Diagnoses and all orders for this visit:  Well female exam with routine gynecological exam -     DG Bone Density; Future -     Ambulatory referral to Gastroenterology -     Cytology - PAP( [facility])  Colon cancer screening -     Ambulatory referral to Gastroenterology  Age-related osteoporosis without current pathological fracture   Pt has Dexa that was noted after her visit. She does not need further imaging at this  Time. Pt reports that she declines treatment because of her fear of cancer. She would be open to natural treatment options. Will set up a f/u visit to review.   [name], M.D., [name]

## 2019-07-02 LAB — CYTOLOGY - PAP
Diagnosis: NEGATIVE
HPV: NOT DETECTED

## 2019-07-05 ENCOUNTER — Ambulatory Visit: Payer: Federal, State, Local not specified - PPO | Admitting: Obstetrics & Gynecology

## 2019-07-19 ENCOUNTER — Ambulatory Visit (INDEPENDENT_AMBULATORY_CARE_PROVIDER_SITE_OTHER): Payer: Federal, State, Local not specified - PPO | Admitting: Family

## 2019-07-19 ENCOUNTER — Encounter: Payer: Self-pay | Admitting: Internal Medicine

## 2019-07-19 ENCOUNTER — Encounter: Payer: Self-pay | Admitting: Family

## 2019-07-19 ENCOUNTER — Telehealth: Payer: Self-pay

## 2019-07-19 ENCOUNTER — Encounter: Payer: Self-pay | Admitting: Obstetrics & Gynecology

## 2019-07-19 ENCOUNTER — Other Ambulatory Visit: Payer: Self-pay

## 2019-07-19 VITALS — BP 112/61 | HR 67 | Resp 16 | Ht 64.0 in | Wt 118.2 lb

## 2019-07-19 DIAGNOSIS — Z0000 Encounter for general adult medical examination without abnormal findings: Secondary | ICD-10-CM

## 2019-07-19 DIAGNOSIS — B351 Tinea unguium: Secondary | ICD-10-CM | POA: Diagnosis not present

## 2019-07-19 DIAGNOSIS — Z5181 Encounter for therapeutic drug level monitoring: Secondary | ICD-10-CM

## 2019-07-19 DIAGNOSIS — Z23 Encounter for immunization: Secondary | ICD-10-CM

## 2019-07-19 DIAGNOSIS — Z0001 Encounter for general adult medical examination with abnormal findings: Secondary | ICD-10-CM | POA: Diagnosis not present

## 2019-07-19 LAB — CBC WITH DIFFERENTIAL/PLATELET
Basophils Absolute: 0 10*3/uL (ref 0.0–0.1)
Basophils Relative: 0.9 % (ref 0.0–3.0)
Eosinophils Absolute: 0.1 10*3/uL (ref 0.0–0.7)
Eosinophils Relative: 2.1 % (ref 0.0–5.0)
HCT: 36.9 % (ref 36.0–46.0)
Hemoglobin: 12.5 g/dL (ref 12.0–15.0)
Lymphocytes Relative: 30.1 % (ref 12.0–46.0)
Lymphs Abs: 1.5 10*3/uL (ref 0.7–4.0)
MCHC: 34 g/dL (ref 30.0–36.0)
MCV: 87.3 fl (ref 78.0–100.0)
Monocytes Absolute: 0.4 10*3/uL (ref 0.1–1.0)
Monocytes Relative: 7.3 % (ref 3.0–12.0)
Neutro Abs: 2.9 10*3/uL (ref 1.4–7.7)
Neutrophils Relative %: 59.6 % (ref 43.0–77.0)
Platelets: 424 10*3/uL — ABNORMAL HIGH (ref 150.0–400.0)
RBC: 4.23 Mil/uL (ref 3.87–5.11)
RDW: 13.2 % (ref 11.5–15.5)
WBC: 4.9 10*3/uL (ref 4.0–10.5)

## 2019-07-19 LAB — BASIC METABOLIC PANEL
BUN: 11 mg/dL (ref 6–23)
CO2: 30 mEq/L (ref 19–32)
Calcium: 9.7 mg/dL (ref 8.4–10.5)
Chloride: 98 mEq/L (ref 96–112)
Creatinine, Ser: 0.67 mg/dL (ref 0.40–1.20)
GFR: 88.08 mL/min (ref >60.00)
Glucose, Bld: 91 mg/dL (ref 70–99)
Potassium: 4.5 mEq/L (ref 3.5–5.1)
Sodium: 134 mEq/L — ABNORMAL LOW (ref 135–145)

## 2019-07-19 LAB — LIPID PANEL
Cholesterol: 146 mg/dL (ref 0–200)
HDL: 74.8 mg/dL (ref >39.00)
LDL Cholesterol: 63 mg/dL (ref 0–99)
NonHDL: 71.23
Total CHOL/HDL Ratio: 2
Triglycerides: 39 mg/dL (ref 0.0–149.0)
VLDL: 7.8 mg/dL (ref 0.0–40.0)

## 2019-07-19 LAB — HEPATIC FUNCTION PANEL
ALT: 14 U/L (ref 0–35)
AST: 22 U/L (ref 0–37)
Albumin: 4.5 g/dL (ref 3.5–5.2)
Alkaline Phosphatase: 59 U/L (ref 39–117)
Bilirubin, Direct: 0.1 mg/dL (ref 0.0–0.3)
Total Bilirubin: 0.5 mg/dL (ref 0.2–1.2)
Total Protein: 6.8 g/dL (ref 6.0–8.3)

## 2019-07-19 LAB — TSH: TSH: 1.91 u[IU]/mL (ref 0.35–4.50)

## 2019-07-19 MED ORDER — TERBINAFINE HCL 250 MG PO TABS: 250.0000 mg | ORAL_TABLET | Freq: Every day | ORAL | 0 refills | Status: DC

## 2019-07-19 MED FILL — TERBINAFINE HCL 250 MG TAB: 250 | 30 days supply | Qty: 30 | Fill #0

## 2019-07-19 NOTE — Patient Instructions (Signed)
Please begin lamisil for your toenails. Continue healthy diet and regular exercise.

## 2019-07-19 NOTE — Telephone Encounter (Signed)
Patient called requesting some ideas from Dr. Ihor Dow about what she could do to help her bone density. Patient states she is still not wanting to take the medications due to "side effects" but wants to know what activities/diet/etc could help. Patient does states she walks regularly for exercise.    Will route to provider for input. Patient requests just a my chart message back with suggestions. Kathrene Alu RN

## 2019-07-19 NOTE — Progress Notes (Signed)
Subjective:    Patient ID: Amy [name], female    DOB: 1951-07-21, 68 y.o.   MRN: [id number]  HPI  Patient presents today for complete physical.  Immunizations: Tdap and pneumovax due Diet: healthy Exercise:  Walks every day Colonoscopy:  due Dexa: 2019 Pap Smear: 2020 Mammogram: 05/2019 Dental: up to date Vision:  1/20 Wt Readings from Last 3 Encounters:  07/19/19 118 lb 3.2 oz (53.6 kg)  06/28/19 118 lb (53.5 kg)  12/22/17 116 lb 3.2 oz (52.7 kg)       Review of Systems  Constitutional: Negative for unexpected weight change.  HENT: Negative for rhinorrhea.   Respiratory: Negative for cough and shortness of breath.   Cardiovascular: Negative for chest pain.  Gastrointestinal: Negative for constipation and diarrhea.  Genitourinary: Negative for dysuria, frequency and hematuria.  Musculoskeletal: Negative for arthralgias and myalgias.  Skin: Negative for rash.  Neurological: Negative for headaches.  Hematological: Negative for adenopathy.  Psychiatric/Behavioral:       Denies depression/anxiety   Past Medical History:  Diagnosis Date  . Fracture    ankle fracture 5/11  . GERD (gastroesophageal reflux disease)   . Osteopenia   . Osteoporosis 12/22/2014     Social History   Socioeconomic History  . Marital status: Widowed    Spouse name: Not on file  . Number of children: 2  . Years of education: Not on file  . Highest education level: Not on file  Occupational History  . Occupation: Marine scientistCLERK    Employer: [employer]  Social Needs  . Financial resource strain: Not on file  . Food insecurity    Worry: Not on file    Inability: Not on file  . Transportation needs    Medical: Not on file    Non-medical: Not on file  Tobacco Use  . Smoking status: Never Smoker  . Smokeless tobacco: Never Used  Substance and Sexual Activity  . Alcohol use: No  . Drug use: No  . Sexual activity: Not on file  Lifestyle  . Physical activity    Days per week: Not  on file    Minutes per session: Not on file  . Stress: Not on file  Relationships  . Social Musicianconnections    Talks on phone: Not on file    Gets together: Not on file    Attends religious service: Not on file    Active member of club or organization: Not on file    Attends meetings of clubs or organizations: Not on file    Relationship status: Not on file  . Intimate partner violence    Fear of current or ex partner: Not on file    Emotionally abused: Not on file    Physically abused: Not on file    Forced sexual activity: Not on file  Other Topics Concern  . Not on file  Social History Narrative   Regular exercise:  Walks daily   Caffeine use:  2 cups coffee daily   Widowed- husband died in 2012   She has 2 daughters   No grand children       Past Surgical History:  Procedure Laterality Date  . AUGMENTATION MAMMAPLASTY    . FRACTURE SURGERY  05/11/2010   lft ankle ORIF  . FRACTURE SURGERY Left 01/19/16   left arm fracture  . HERNIA REPAIR  2009   umbilical hernia/bilat inguinal repair    Family History  Problem Relation Age of Onset  . Kidney  failure Father   . CAD Father   . Arthritis Sister   . Cancer Sister        uterine cancer    Allergies  Allergen Reactions  . Alendronate Sodium Other (See Comments)    Tightness in throat and chest.  . Codeine Nausea Only    Current Outpatient Medications on File Prior to Visit  Medication Sig Dispense Refill  . B Complex Vitamins (B COMPLEX-B12 PO) Take 1 tablet by mouth daily.    Marland Kitchen BIOTIN PO Take 1 tablet by mouth daily.    . Calcium Carbonate-Vitamin D 600-400 MG-UNIT per chew tablet Chew 1 tablet by mouth 2 (two) times daily.      . Coenzyme Q10 (CO Q-10 PO) Take 1 tablet by mouth daily.    . fish oil-omega-3 fatty acids 1000 MG capsule Take 1 g by mouth daily.    . Multiple Vitamins-Minerals (MULTIVITAMIN WITH MINERALS) tablet Take 1 tablet by mouth daily.     No current facility-administered medications on file  prior to visit.     BP 112/61   Pulse 67   Resp 16   Ht 5\' 4"  (1.626 m)   Wt 118 lb 3.2 oz (53.6 kg)   SpO2 100%   BMI 20.29 kg/m        Objective:   Physical Exam Physical Exam  Constitutional: She is oriented to person, place, and time. She appears well-developed and well-nourished. No distress.  HENT:  Head: Normocephalic and atraumatic.  Right Ear: Tympanic membrane and ear canal normal.  Left Ear: Tympanic membrane and ear canal normal.  Mouth/Throat: Oropharynx is clear and moist.  Eyes: Pupils are equal, round, and reactive to light. No scleral icterus.  Neck: Normal range of motion. No thyromegaly present.  Cardiovascular: Normal rate and regular rhythm.   No murmur heard. Pulmonary/Chest: Effort normal and breath sounds normal. No respiratory distress. He has no wheezes. She has no rales. She exhibits no tenderness.  Abdominal: Soft. Bowel sounds are normal. She exhibits no distension and no mass. There is no tenderness. There is no rebound and no guarding.  Musculoskeletal: She exhibits no edema.  Lymphadenopathy:    She has no cervical adenopathy.  Neurological: She is alert and oriented to person, place, and time. She has normal patellar reflexes. She exhibits normal muscle tone. Coordination normal.  Skin: Skin is warm and dry. hypertrophic discolored toenails, most notable on bilateral great toes Psychiatric: She has a normal mood and affect. Her behavior is normal. Judgment and thought content normal. Breast/pelvic: deferred     Assessment & Plan:   Preventative care- continue healthy diet, exercise. Tdap and Prevnar today.  Recommended flu shot in the fall. Obtain routine lab work. Refer for colonoscopy.  Pap and mammo up to date. Obtain routine lab work.  Onychomycosis- rx with lamisil x 12 weeks, plan 1 month follow up LFT.       Assessment & Plan:

## 2019-07-21 ENCOUNTER — Encounter: Payer: Self-pay | Admitting: Family

## 2019-08-12 ENCOUNTER — Ambulatory Visit (AMBULATORY_SURGERY_CENTER): Payer: Self-pay | Admitting: *Deleted

## 2019-08-12 ENCOUNTER — Other Ambulatory Visit: Payer: Self-pay

## 2019-08-12 VITALS — Temp 97.3°F | Ht 64.0 in | Wt 118.0 lb

## 2019-08-12 DIAGNOSIS — Z1211 Encounter for screening for malignant neoplasm of colon: Secondary | ICD-10-CM

## 2019-08-12 NOTE — Progress Notes (Signed)
Patient is here in-person for PV. Patient denies any allergies to eggs or soy. Patient denies any problems with anesthesia/sedation. Patient denies any oxygen use at home. Patient denies taking any diet/weight loss medications or blood thinners. EMMI education assisgned to patient on colonoscopy, this was explained and instructions given to patient.Pt is aware that care partner will wait in the car during procedure; if they feel like they will be too hot to wait in the car; they may wait in the lobby.  We want them to wear a mask (we do not have any that we can provide them), practice social distancing, and we will check their temperatures when they get here.  I did remind patient that their care partner needs to stay in the parking lot the entire time. Pt will wear mask into building.  Patient is aware to call us if she gets exposed to Covid-19.

## 2019-08-13 ENCOUNTER — Telehealth: Payer: Self-pay | Admitting: Family

## 2019-08-13 NOTE — Telephone Encounter (Signed)
Patient called stating she has not started her new medication terbinafine (LAMISIL. Patient is not wanting to take medication until after beach trip 9/4-9/12.  Patient states a side effect of medication is sunburn. Patient would like PCP nurse to call bacl Call back [phone]

## 2019-08-14 ENCOUNTER — Encounter: Payer: Self-pay | Admitting: Internal Medicine

## 2019-08-14 NOTE — Telephone Encounter (Signed)
Lvm for patient to call us back about this

## 2019-08-16 NOTE — Telephone Encounter (Signed)
Noted  

## 2019-08-26 ENCOUNTER — Other Ambulatory Visit: Payer: Federal, State, Local not specified - PPO

## 2019-08-26 ENCOUNTER — Ambulatory Visit (AMBULATORY_SURGERY_CENTER): Payer: Federal, State, Local not specified - PPO | Admitting: Internal Medicine

## 2019-08-26 ENCOUNTER — Encounter: Payer: Self-pay | Admitting: Internal Medicine

## 2019-08-26 ENCOUNTER — Other Ambulatory Visit: Payer: Self-pay

## 2019-08-26 VITALS — BP 124/63 | HR 67 | Temp 97.8°F | Resp 15 | Ht 64.0 in | Wt 118.0 lb

## 2019-08-26 DIAGNOSIS — Z1211 Encounter for screening for malignant neoplasm of colon: Secondary | ICD-10-CM | POA: Diagnosis not present

## 2019-08-26 MED ORDER — SODIUM CHLORIDE 0.9 % IV SOLN: 500.0000 mL | Freq: Once | INTRAVENOUS | Status: DC

## 2019-08-26 NOTE — Progress Notes (Signed)
Temp-June Vitals-Courtney  Pt's states no medical or surgical changes since previsit or office visit. 

## 2019-08-26 NOTE — Patient Instructions (Addendum)
No polyps or cancer seen.  I am not recommending another routine colonoscopy as these usually stop > 75 and in 10 years you will be 78.  You do have diverticulosis - thickened muscle rings and pouches in the colon wall. Please read the handout about this condition.  I appreciate the opportunity to care for you. Gatha Mayer, MD, [facility]  Please read handouts provided. Continue present medications.      YOU HAD AN ENDOSCOPIC PROCEDURE TODAY AT [facility]:   Refer to the procedure report that was given to you for any specific questions about what was found during the examination.  If the procedure report does not answer your questions, please call your gastroenterologist to clarify.  If you requested that your care partner not be given the details of your procedure findings, then the procedure report has been included in a sealed envelope for you to review at your convenience later.  YOU SHOULD EXPECT: Some feelings of bloating in the abdomen. Passage of more gas than usual.  Walking can help get rid of the air that was put into your GI tract during the procedure and reduce the bloating. If you had a lower endoscopy (such as a colonoscopy or flexible sigmoidoscopy) you may notice spotting of blood in your stool or on the toilet paper. If you underwent a bowel prep for your procedure, you may not have a normal bowel movement for a few days.  Please Note:  You might notice some irritation and congestion in your nose or some drainage.  This is from the oxygen used during your procedure.  There is no need for concern and it should clear up in a day or so.  SYMPTOMS TO REPORT IMMEDIATELY:   Following lower endoscopy (colonoscopy or flexible sigmoidoscopy):  Excessive amounts of blood in the stool  Significant tenderness or worsening of abdominal pains  Swelling of the abdomen that is new, acute  Fever of 100F or higher    For urgent or emergent issues, a  gastroenterologist can be reached at any hour by calling [phone].   DIET:  We do recommend a small meal at first, but then you may proceed to your regular diet.  Drink plenty of fluids but you should avoid alcoholic beverages for 24 hours.  ACTIVITY:  You should plan to take it easy for the rest of today and you should NOT DRIVE or use heavy machinery until tomorrow (because of the sedation medicines used during the test).    FOLLOW UP: Our staff will call the number listed on your records 48-72 hours following your procedure to check on you and address any questions or concerns that you may have regarding the information given to you following your procedure. If we do not reach you, we will leave a message.  We will attempt to reach you two times.  During this call, we will ask if you have developed any symptoms of COVID 19. If you develop any symptoms (ie: fever, flu-like symptoms, shortness of breath, cough etc.) before then, please call [phone].  If you test positive for Covid 19 in the 2 weeks post procedure, please call and report this information to [place].    If any biopsies were taken you will be contacted by phone or by letter within the next 1-3 weeks.  Please call us at [phone] if you have not heard about the biopsies in 3 weeks.    SIGNATURES/CONFIDENTIALITY: You and/or your care partner have  signed paperwork which will be entered into your electronic medical record.  These signatures attest to the fact that that the information above on your After Visit Summary has been reviewed and is understood.  Full responsibility of the confidentiality of this discharge information lies with you and/or your care-partner.

## 2019-08-26 NOTE — Progress Notes (Signed)
Report given to PACU, vss 

## 2019-08-26 NOTE — Progress Notes (Signed)
Temp checked by JB/vitals checked by CW.  No medical history changes since previsit with Amy [name].

## 2019-08-26 NOTE — Op Note (Signed)
[place] Patient Name: Amy [name] Procedure Date: 08/26/2019 8:33 AM MRN: [id number] Endoscopist: Gatha Mayer , MD Age: 68 Referring MD:  Date of Birth: 1951/02/08 Gender: Female Account #: [id number] Procedure:                Colonoscopy Indications:              Screening for colorectal malignant neoplasm, Last                            colonoscopy: 2008 Medicines:                Propofol per Anesthesia, Monitored Anesthesia Care Procedure:                Pre-Anesthesia Assessment:                           - Prior to the procedure, a History and Physical                            was performed, and patient medications and                            allergies were reviewed. The patient's tolerance of                            previous anesthesia was also reviewed. The risks                            and benefits of the procedure and the sedation                            options and risks were discussed with the patient.                            All questions were answered, and informed consent                            was obtained. Prior Anticoagulants: The patient has                            taken no previous anticoagulant or antiplatelet                            agents. ASA Grade Assessment: I - A normal, healthy                            patient. After reviewing the risks and benefits,                            the patient was deemed in satisfactory condition to                            undergo the procedure.  After obtaining informed consent, the colonoscope                            was passed under direct vision. Throughout the                            procedure, the patient's blood pressure, pulse, and                            oxygen saturations were monitored continuously. The                            Colonoscope was introduced through the anus and                            advanced to the the cecum,  identified by                            appendiceal orifice and ileocecal valve. The                            colonoscopy was performed without difficulty. The                            patient tolerated the procedure well. The quality                            of the bowel preparation was good. The ileocecal                            valve, appendiceal orifice, and rectum were                            photographed. The bowel preparation used was                            Miralax via split dose instruction. Scope In: 8:43:50 AM Scope Out: 8:56:39 AM Scope Withdrawal Time: 0 hours 10 minutes 18 seconds  Total Procedure Duration: 0 hours 12 minutes 49 seconds  Findings:                 The perianal and digital rectal examinations were                            normal.                           A few small-mouthed diverticula were found in the                            sigmoid colon.                           The exam was otherwise without abnormality on  direct and retroflexion views. Complications:            No immediate complications. Estimated Blood Loss:     Estimated blood loss: none. Impression:               - Diverticulosis in the sigmoid colon.                           - The examination was otherwise normal on direct                            and retroflexion views.                           - No specimens collected. Recommendation:           - Patient has a contact number available for                            emergencies. The signs and symptoms of potential                            delayed complications were discussed with the                            patient. Return to normal activities tomorrow.                            Written discharge instructions were provided to the                            patient.                           - Resume previous diet.                           - Continue present medications.                            - No repeat colonoscopy due to current age (25                            years or older) and the absence of colonic polyps. Iva Boop, MD 08/26/2019 9:02:11 AM This report has been signed electronically.

## 2019-08-28 ENCOUNTER — Telehealth: Payer: Self-pay

## 2019-08-28 NOTE — Telephone Encounter (Signed)
First attempt follow up call to pt after procedure, LM on VM.

## 2019-08-28 NOTE — Telephone Encounter (Signed)
No answer, left message to call if having any issues or concerns, B.Soundra Lampley RN 

## 2019-09-27 ENCOUNTER — Other Ambulatory Visit: Payer: Federal, State, Local not specified - PPO

## 2019-10-25 ENCOUNTER — Ambulatory Visit: Payer: Federal, State, Local not specified - PPO | Admitting: Family

## 2019-10-30 ENCOUNTER — Other Ambulatory Visit: Payer: Self-pay

## 2019-10-30 ENCOUNTER — Telehealth: Payer: Self-pay | Admitting: Family

## 2019-10-30 ENCOUNTER — Ambulatory Visit: Payer: Federal, State, Local not specified - PPO | Admitting: Family

## 2019-10-30 ENCOUNTER — Telehealth: Payer: Self-pay | Admitting: *Deleted

## 2019-10-30 ENCOUNTER — Ambulatory Visit (HOSPITAL_BASED_OUTPATIENT_CLINIC_OR_DEPARTMENT_OTHER)
Admission: RE | Admit: 2019-10-30 | Discharge: 2019-10-30 | Disposition: A | Discharge status: Home / Self Care | Payer: Federal, State, Local not specified - PPO | Source: Ambulatory Visit | Attending: Family | Admitting: Family

## 2019-10-30 ENCOUNTER — Encounter: Payer: Self-pay | Admitting: Family

## 2019-10-30 VITALS — BP 125/63 | HR 81 | Temp 97.8°F | Resp 16 | Ht 64.0 in | Wt 114.0 lb

## 2019-10-30 DIAGNOSIS — S62102A Fracture of unspecified carpal bone, left wrist, initial encounter for closed fracture: Secondary | ICD-10-CM

## 2019-10-30 DIAGNOSIS — S52502A Unspecified fracture of the lower end of left radius, initial encounter for closed fracture: Secondary | ICD-10-CM | POA: Diagnosis not present

## 2019-10-30 DIAGNOSIS — M25532 Pain in left wrist: Secondary | ICD-10-CM | POA: Diagnosis not present

## 2019-10-30 DIAGNOSIS — S52572A Other intraarticular fracture of lower end of left radius, initial encounter for closed fracture: Secondary | ICD-10-CM | POA: Diagnosis not present

## 2019-10-30 DIAGNOSIS — S6992XA Unspecified injury of left wrist, hand and finger(s), initial encounter: Secondary | ICD-10-CM | POA: Diagnosis not present

## 2019-10-30 NOTE — Telephone Encounter (Signed)
Copied from [place] [id number]. Topic: Referral - Question >> Oct 30, 2019  1:24 PM Wynetta Emery, [place] C wrote: Reason for CRM: pt called in inquiring about her Ortho referral. Pt is requesting to speak with Rod Holler.   Please advise.   CB: [phone]

## 2019-10-30 NOTE — Progress Notes (Signed)
Subjective:    Patient ID: Amy [name], female    DOB: 11-16-1951, 67 y.o.   MRN: [id number]  HPI  Patient is a 68 yr old female who presents today following a fall yesterday afternoon. Reports left wrist injury. She reports that she went outside to trim her hydrangea bush. Had clogs on and tripped.  She broke her fall with her left wrist.  She elevated her wrist and iced it.  Notes some swelling today which is uncomfortable. She has been taking advil.    She has been established at [facility] if needed.   Review of Systems See HPI  Past Medical History:  Diagnosis Date  . Fracture    ankle fracture 5/11  . GERD (gastroesophageal reflux disease)   . Osteopenia   . Osteoporosis 12/22/2014  . Post-operative nausea and vomiting      Social History   Socioeconomic History  . Marital status: Widowed    Spouse name: Not on file  . Number of children: 2  . Years of education: Not on file  . Highest education level: Not on file  Occupational History  . Occupation: Armed forces operational officer: [employer]  Social Needs  . Financial resource strain: Not on file  . Food insecurity    Worry: Not on file    Inability: Not on file  . Transportation needs    Medical: Not on file    Non-medical: Not on file  Tobacco Use  . Smoking status: Never Smoker  . Smokeless tobacco: Never Used  Substance and Sexual Activity  . Alcohol use: No  . Drug use: No  . Sexual activity: Not on file  Lifestyle  . Physical activity    Days per week: Not on file    Minutes per session: Not on file  . Stress: Not on file  Relationships  . Social Herbalist on phone: Not on file    Gets together: Not on file    Attends religious service: Not on file    Active member of club or organization: Not on file    Attends meetings of clubs or organizations: Not on file    Relationship status: Not on file  . Intimate partner violence    Fear of current or ex partner: Not on file   Emotionally abused: Not on file    Physically abused: Not on file    Forced sexual activity: Not on file  Other Topics Concern  . Not on file  Social History Narrative   Regular exercise:  Walks daily   Caffeine use:  2 cups coffee daily   Widowed- husband died in March 27, 2011   She has 2 daughters   No grand children       Past Surgical History:  Procedure Laterality Date  . AUGMENTATION MAMMAPLASTY    . COLONOSCOPY  11/23/2007  . FRACTURE SURGERY  05/11/2010   lft ankle ORIF  . FRACTURE SURGERY Left 01/19/16   left arm fracture  . HERNIA REPAIR  2952   umbilical hernia/bilat inguinal repair    Family History  Problem Relation Age of Onset  . Kidney failure Father   . CAD Father   . Arthritis Sister   . Colon polyps Sister   . Cancer Sister        uterine cancer  . Colon cancer Neg Hx   . Esophageal cancer Neg Hx   . Stomach cancer Neg Hx   . Rectal  cancer Neg Hx     Allergies  Allergen Reactions  . Alendronate Sodium Other (See Comments)    Tightness in throat and chest.  . Codeine Nausea Only    Current Outpatient Medications on File Prior to Visit  Medication Sig Dispense Refill  . Calcium Carbonate-Vitamin D 600-400 MG-UNIT per chew tablet Chew 1 tablet by mouth 2 (two) times daily.      Marland Kitchen terbinafine (LAMISIL) 250 MG tablet Take 1 tablet (250 mg total) by mouth daily. 30 tablet 0  . B Complex Vitamins (B COMPLEX-B12 PO) Take 1 tablet by mouth daily.     No current facility-administered medications on file prior to visit.     BP 125/63 (BP Location: Right Arm, Patient Position: Sitting, Cuff Size: Small)   Pulse 81   Temp 97.8 F (36.6 C) (Temporal)   Resp 16   Ht 5\' 4"  (1.626 m)   Wt 114 lb (51.7 kg)   SpO2 98%   BMI 19.57 kg/m       Objective:   Physical Exam Constitutional:      Appearance: She is well-developed.  Musculoskeletal:     Comments: + swelling of left wrist, mild tenderness to palatpaion. Decreased ROM (adduction/abduction). Some  swelling noted of hand and fingers as well.  Psychiatric:        Behavior: Behavior normal.        Thought Content: Thought content normal.        Judgment: Judgment normal.           Assessment & Plan:   Left wrist pain-  Will obtain x-rays to evaluate from fracture. If + for fracture plan to refer to [place] [facility] at her request. Continue ibuprofen/ice/elevation as able.

## 2019-10-30 NOTE — Patient Instructions (Addendum)
Please complete x ray on the first floor. We will contact you with your results.  

## 2019-10-30 NOTE — Telephone Encounter (Signed)
Reviewed results with pt and need to see orthopedics. Will place referral now and try to get her in today.

## 2019-11-04 NOTE — Telephone Encounter (Signed)
Lvm for patient to call back about referral

## 2019-11-05 DIAGNOSIS — S52572A Other intraarticular fracture of lower end of left radius, initial encounter for closed fracture: Secondary | ICD-10-CM | POA: Diagnosis not present

## 2019-11-06 NOTE — Telephone Encounter (Signed)
lvm again today for patient to call if she is still having questions about her referral.

## 2019-11-06 NOTE — Telephone Encounter (Signed)
Pt states seen referral and  Already in a cast! Thanks

## 2019-12-10 DIAGNOSIS — S52572A Other intraarticular fracture of lower end of left radius, initial encounter for closed fracture: Secondary | ICD-10-CM | POA: Diagnosis not present

## 2020-01-07 DIAGNOSIS — S52572A Other intraarticular fracture of lower end of left radius, initial encounter for closed fracture: Secondary | ICD-10-CM | POA: Diagnosis not present

## 2020-02-06 ENCOUNTER — Ambulatory Visit: Payer: Federal, State, Local not specified - PPO

## 2020-02-06 DIAGNOSIS — S52572A Other intraarticular fracture of lower end of left radius, initial encounter for closed fracture: Secondary | ICD-10-CM | POA: Diagnosis not present

## 2020-06-16 ENCOUNTER — Other Ambulatory Visit (HOSPITAL_BASED_OUTPATIENT_CLINIC_OR_DEPARTMENT_OTHER): Payer: Self-pay | Admitting: Family

## 2020-06-16 DIAGNOSIS — Z1231 Encounter for screening mammogram for malignant neoplasm of breast: Secondary | ICD-10-CM

## 2020-06-19 ENCOUNTER — Other Ambulatory Visit: Payer: Self-pay

## 2020-06-19 ENCOUNTER — Ambulatory Visit (HOSPITAL_BASED_OUTPATIENT_CLINIC_OR_DEPARTMENT_OTHER)
Admission: RE | Admit: 2020-06-19 | Discharge: 2020-06-19 | Disposition: A | Discharge status: Home / Self Care | Payer: Federal, State, Local not specified - PPO | Source: Ambulatory Visit | Attending: Family | Admitting: Family

## 2020-06-19 DIAGNOSIS — Z1231 Encounter for screening mammogram for malignant neoplasm of breast: Secondary | ICD-10-CM | POA: Insufficient documentation

## 2020-08-19 MED FILL — METHYLPREDNISOLONE 4 MG TBP: 4 | 6 days supply | Qty: 21 | Fill #0

## 2020-08-19 MED FILL — AMOXICILLIN 500 MG CAPSULE: 500 | 7 days supply | Qty: 21 | Fill #0

## 2020-10-28 ENCOUNTER — Telehealth: Payer: Self-pay

## 2020-10-28 DIAGNOSIS — D75839 Thrombocytosis, unspecified: Secondary | ICD-10-CM

## 2020-10-28 DIAGNOSIS — E871 Hypo-osmolality and hyponatremia: Secondary | ICD-10-CM

## 2020-10-28 NOTE — Telephone Encounter (Signed)
Pt called wanting to schedule an appointment for a physical. Pt asked if Efraim Kaufmann would put in lab work for her to come get labs done before physical. Please advise.

## 2020-10-29 NOTE — Telephone Encounter (Signed)
Orders have been placed.  I did not order thyroid or cholesterol because these were normal last year and insurance is not covering screening labs.  I did put in kidney function, sugar, liver, blood count.

## 2020-11-03 ENCOUNTER — Other Ambulatory Visit: Payer: Self-pay

## 2020-11-03 ENCOUNTER — Other Ambulatory Visit (INDEPENDENT_AMBULATORY_CARE_PROVIDER_SITE_OTHER): Payer: Federal, State, Local not specified - PPO

## 2020-11-03 DIAGNOSIS — E871 Hypo-osmolality and hyponatremia: Secondary | ICD-10-CM

## 2020-11-03 DIAGNOSIS — D75839 Thrombocytosis, unspecified: Secondary | ICD-10-CM

## 2020-11-03 LAB — COMPREHENSIVE METABOLIC PANEL
ALT: 21 U/L (ref 0–35)
AST: 29 U/L (ref 0–37)
Albumin: 4.3 g/dL (ref 3.5–5.2)
Alkaline Phosphatase: 55 U/L (ref 39–117)
BUN: 18 mg/dL (ref 6–23)
CO2: 31 mEq/L (ref 19–32)
Calcium: 9.5 mg/dL (ref 8.4–10.5)
Chloride: 101 mEq/L (ref 96–112)
Creatinine, Ser: 0.71 mg/dL (ref 0.40–1.20)
GFR: 88.1 mL/min (ref >60.00)
Glucose, Bld: 103 mg/dL — ABNORMAL HIGH (ref 70–99)
Potassium: 4.7 mEq/L (ref 3.5–5.1)
Sodium: 135 mEq/L (ref 135–145)
Total Bilirubin: 0.5 mg/dL (ref 0.2–1.2)
Total Protein: 7.2 g/dL (ref 6.0–8.3)

## 2020-11-03 LAB — CBC WITH DIFFERENTIAL/PLATELET
Basophils Absolute: 0 10*3/uL (ref 0.0–0.1)
Basophils Relative: 0.7 % (ref 0.0–3.0)
Eosinophils Absolute: 0.1 10*3/uL (ref 0.0–0.7)
Eosinophils Relative: 2.5 % (ref 0.0–5.0)
HCT: 41.1 % (ref 36.0–46.0)
Hemoglobin: 13.9 g/dL (ref 12.0–15.0)
Lymphocytes Relative: 29.3 % (ref 12.0–46.0)
Lymphs Abs: 1.6 10*3/uL (ref 0.7–4.0)
MCHC: 33.8 g/dL (ref 30.0–36.0)
MCV: 87.9 fl (ref 78.0–100.0)
Monocytes Absolute: 0.4 10*3/uL (ref 0.1–1.0)
Monocytes Relative: 7.7 % (ref 3.0–12.0)
Neutro Abs: 3.2 10*3/uL (ref 1.4–7.7)
Neutrophils Relative %: 59.8 % (ref 43.0–77.0)
Platelets: 497 10*3/uL — ABNORMAL HIGH (ref 150.0–400.0)
RBC: 4.68 Mil/uL (ref 3.87–5.11)
RDW: 13.8 % (ref 11.5–15.5)
WBC: 5.4 10*3/uL (ref 4.0–10.5)

## 2020-11-20 ENCOUNTER — Encounter: Payer: Self-pay | Admitting: Family

## 2020-11-20 ENCOUNTER — Other Ambulatory Visit: Payer: Self-pay

## 2020-11-20 ENCOUNTER — Ambulatory Visit (INDEPENDENT_AMBULATORY_CARE_PROVIDER_SITE_OTHER): Payer: Federal, State, Local not specified - PPO | Admitting: Family

## 2020-11-20 VITALS — BP 119/59 | HR 85 | Temp 97.8°F | Resp 16 | Ht 64.0 in | Wt 111.4 lb

## 2020-11-20 DIAGNOSIS — Z0000 Encounter for general adult medical examination without abnormal findings: Secondary | ICD-10-CM

## 2020-11-20 DIAGNOSIS — M810 Age-related osteoporosis without current pathological fracture: Secondary | ICD-10-CM | POA: Diagnosis not present

## 2020-11-20 NOTE — Progress Notes (Signed)
Subjective:    Patient ID: Amy [name], female    DOB: [birth year], 69 y.o.   MRN: [id number]  HPI  Patient is a 69 yr old female who presents today for cpx.  Immunizations:  Tdap 04-04-2019, shingrix x 2, flu shot 09/11/20.  Diet: healthy diet Wt Readings from Last 3 Encounters:  11/20/20 111 lb 6.4 oz (50.5 kg)  10/30/19 114 lb (51.7 kg)  08/26/19 118 lb (53.5 kg)  Exercise: walks 8 miles a day 7 days a week Colonoscopy: 08/26/19 Dexa: 03/20/18 Pap Smear: N/A Mammogram: 06/19/2020 Vision: up to date Dental:  Up to date     Review of Systems  Constitutional: Negative for unexpected weight change.  HENT: Negative for hearing loss and rhinorrhea.   Eyes: Negative for visual disturbance.  Respiratory: Negative for cough and shortness of breath.   Cardiovascular: Negative for chest pain.  Gastrointestinal: Negative for constipation and diarrhea.  Genitourinary: Negative for dysuria and frequency.  Musculoskeletal: Negative for arthralgias and myalgias.  Neurological: Positive for headaches (rare in the fall).  Hematological: Negative for adenopathy.  Psychiatric/Behavioral:       Denies depression/anxiety   Past Medical History:  Diagnosis Date  . Fracture    ankle fracture 5/11  . GERD (gastroesophageal reflux disease)   . Osteopenia   . Osteoporosis 12/22/2014  . Post-operative nausea and vomiting      Social History   Socioeconomic History  . Marital status: Widowed    Spouse name: Not on file  . Number of children: 2  . Years of education: Not on file  . Highest education level: Not on file  Occupational History  . Occupation: Marine scientist: [employer]  Tobacco Use  . Smoking status: Never Smoker  . Smokeless tobacco: Never Used  Vaping Use  . Vaping Use: Never used  Substance and Sexual Activity  . Alcohol use: No  . Drug use: No  . Sexual activity: Not on file  Other Topics Concern  . Not on file  Social History Narrative   Regular exercise:   Walks daily   Caffeine use:  2 cups coffee daily   Widowed- husband died in 04/04/2011   She has 2 daughters   No grand children      Social Determinants of Corporate investment banker Strain: Not on file  Food Insecurity: Not on file  Transportation Needs: Not on file  Physical Activity: Not on file  Stress: Not on file  Social Connections: Not on file  Intimate Partner Violence: Not on file    Past Surgical History:  Procedure Laterality Date  . AUGMENTATION MAMMAPLASTY    . COLONOSCOPY  11/23/2007  . FRACTURE SURGERY  05/11/2010   lft ankle ORIF  . FRACTURE SURGERY Left 01/19/16   left arm fracture  . HERNIA REPAIR  April 03, 2008   umbilical hernia/bilat inguinal repair    Family History  Problem Relation Age of Onset  . Kidney failure Father   . CAD Father   . Arthritis Sister   . Colon polyps Sister   . Cancer Sister        uterine cancer  . Colon cancer Neg Hx   . Esophageal cancer Neg Hx   . Stomach cancer Neg Hx   . Rectal cancer Neg Hx     Allergies  Allergen Reactions  . Alendronate Sodium Other (See Comments)    Tightness in throat and chest.  . Codeine Nausea Only  Current Outpatient Medications on File Prior to Visit  Medication Sig Dispense Refill  . B Complex Vitamins (B COMPLEX-B12 PO) Take 1 tablet by mouth daily.    . Calcium Carbonate-Vitamin D 600-400 MG-UNIT per chew tablet Chew 1 tablet by mouth 2 (two) times daily.     No current facility-administered medications on file prior to visit.    BP (!) 119/59 (BP Location: Right Arm, Patient Position: Sitting, Cuff Size: Small)   Pulse 85   Temp 97.8 F (36.6 C) (Oral)   Resp 16   Ht 5\' 4"  (1.626 m)   Wt 111 lb 6.4 oz (50.5 kg)   SpO2 99%   BMI 19.12 kg/m       Objective:   Physical Exam  Physical Exam  Constitutional: She is oriented to person, place, and time. She appears well-developed and well-nourished. No distress.  HENT:  Head: Normocephalic and atraumatic.  Right Ear: Tympanic  membrane and ear canal normal.  Left Ear: Tympanic membrane and ear canal normal.  Mouth/Throat: Not examined- pt wearing mask Eyes: Pupils are equal, round, and reactive to light. No scleral icterus.  Neck: Normal range of motion. No thyromegaly present.  Cardiovascular: Normal rate and regular rhythm.   No murmur heard. Pulmonary/Chest: Effort normal and breath sounds normal. No respiratory distress. He has no wheezes. She has no rales. She exhibits no tenderness.  Abdominal: Soft. Bowel sounds are normal. She exhibits no distension and no mass. There is no tenderness. There is no rebound and no guarding.  Musculoskeletal: She exhibits no edema.  Lymphadenopathy:    She has no cervical adenopathy.  Neurological: She is alert and oriented to person, place, and time. She has normal patellar reflexes. She exhibits normal muscle tone. Coordination normal.  Skin: Skin is warm and dry.  Psychiatric: She has a normal mood and affect. Her behavior is normal. Judgment and thought content normal.  Breasts: exam limited due to bilateral calcified breast implants.            Assessment & Plan:   Preventative care- encouraged pt to continue healthy diet, exercise. Completed labs prior to visit which I reviewed with the patient. Immunizations reviewed and up to date.  Mammo/colo up to date.   Osteoporosis-  We discussed her severe osteoporosis. She is allergic to fosamax.  She is concerned about the potential side effects of Prolia.  I advised her to look at the patient information on .  Will repeat dexa scan.  We discussed morbidity/mortality associated with hip fractures as well.  She will think about it and let me know if she decides she would like to proceed with prolia. Continue caltrate and regular walking.  This visit occurred during the SARS-CoV-2 public health emergency.  Safety protocols were in place, including screening questions prior to the visit, additional usage of staff  PPE, and extensive cleaning of exam room while observing appropriate contact time as indicated for disinfecting solutions.       Assessment & Plan:

## 2020-11-20 NOTE — Patient Instructions (Signed)
Please continue healthy diet and regular exercise.    Preventive Care 20 Years and Older, Female Preventive care refers to lifestyle choices and visits with your health care provider that can promote health and wellness. This includes:  A yearly physical exam. This is also called an annual well check.  Regular dental and eye exams.  Immunizations.  Screening for certain conditions.  Healthy lifestyle choices, such as diet and exercise. What can I expect for my preventive care visit? Physical exam Your health care provider will check:  Height and weight. These may be used to calculate body mass index (BMI), which is a measurement that tells if you are at a healthy weight.  Heart rate and blood pressure.  Your skin for abnormal spots. Counseling Your health care provider may ask you questions about:  Alcohol, tobacco, and drug use.  Emotional well-being.  Home and relationship well-being.  Sexual activity.  Eating habits.  History of falls.  Memory and ability to understand (cognition).  Work and work Statistician.  Pregnancy and menstrual history. What immunizations do I need?  Influenza (flu) vaccine  This is recommended every year. Tetanus, diphtheria, and pertussis (Tdap) vaccine  You may need a Td booster every 10 years. Varicella (chickenpox) vaccine  You may need this vaccine if you have not already been vaccinated. Zoster (shingles) vaccine  You may need this after age 26. Pneumococcal conjugate (PCV13) vaccine  One dose is recommended after age 30. Pneumococcal polysaccharide (PPSV23) vaccine  One dose is recommended after age 10. Measles, mumps, and rubella (MMR) vaccine  You may need at least one dose of MMR if you were born in 1957 or later. You may also need a second dose. Meningococcal conjugate (MenACWY) vaccine  You may need this if you have certain conditions. Hepatitis A vaccine  You may need this if you have certain conditions or  if you travel or work in places where you may be exposed to hepatitis A. Hepatitis B vaccine  You may need this if you have certain conditions or if you travel or work in places where you may be exposed to hepatitis B. Haemophilus influenzae type b (Hib) vaccine  You may need this if you have certain conditions. You may receive vaccines as individual doses or as more than one vaccine together in one shot (combination vaccines). Talk with your health care provider about the risks and benefits of combination vaccines. What tests do I need? Blood tests  Lipid and cholesterol levels. These may be checked every 5 years, or more frequently depending on your overall health.  Hepatitis C test.  Hepatitis B test. Screening  Lung cancer screening. You may have this screening every year starting at age 36 if you have a 30-pack-year history of smoking and currently smoke or have quit within the past 15 years.  Colorectal cancer screening. All adults should have this screening starting at age 57 and continuing until age 25. Your health care provider may recommend screening at age 47 if you are at increased risk. You will have tests every 1-10 years, depending on your results and the type of screening test.  Diabetes screening. This is done by checking your blood sugar (glucose) after you have not eaten for a while (fasting). You may have this done every 1-3 years.  Mammogram. This may be done every 1-2 years. Talk with your health care provider about how often you should have regular mammograms.  BRCA-related cancer screening. This may be done if you have  a family history of breast, ovarian, tubal, or peritoneal cancers. Other tests  Sexually transmitted disease (STD) testing.  Bone density scan. This is done to screen for osteoporosis. You may have this done starting at age 5. Follow these instructions at home: Eating and drinking  Eat a diet that includes fresh fruits and vegetables, whole  grains, lean protein, and low-fat dairy products. Limit your intake of foods with high amounts of sugar, saturated fats, and salt.  Take vitamin and mineral supplements as recommended by your health care provider.  Do not drink alcohol if your health care provider tells you not to drink.  If you drink alcohol: ? Limit how much you have to 0-1 drink a day. ? Be aware of how much alcohol is in your drink. In the [place] one drink equals one 12 oz bottle of beer (355 mL), one 5 oz glass of wine (148 mL), or one 1 oz glass of hard liquor (44 mL). Lifestyle  Take daily care of your teeth and gums.  Stay active. Exercise for at least 30 minutes on 5 or more days each week.  Do not use any products that contain nicotine or tobacco, such as cigarettes, e-cigarettes, and chewing tobacco. If you need help quitting, ask your health care provider.  If you are sexually active, practice safe sex. Use a condom or other form of protection in order to prevent STIs (sexually transmitted infections).  Talk with your health care provider about taking a low-dose aspirin or statin. What's next?  Go to your health care provider once a year for a well check visit.  Ask your health care provider how often you should have your eyes and teeth checked.  Stay up to date on all vaccines. This information is not intended to replace advice given to you by your health care provider. Make sure you discuss any questions you have with your health care provider. Document Revised: 11/22/2018 Document Reviewed: 11/22/2018 Elsevier Patient Education  2020 [employer].

## 2020-11-24 ENCOUNTER — Telehealth: Payer: Self-pay | Admitting: Family

## 2020-11-24 NOTE — Telephone Encounter (Signed)
Letter printed and will be mailed out to patient at address on file

## 2020-11-24 NOTE — Telephone Encounter (Signed)
[name] Amy [name]  Call Back # [phone]  Patient states she needs a note stating she was seen here at [facility] on 11/20/2020. Patient would like note mailed to her home.

## 2020-11-27 ENCOUNTER — Ambulatory Visit (HOSPITAL_BASED_OUTPATIENT_CLINIC_OR_DEPARTMENT_OTHER)
Admission: RE | Admit: 2020-11-27 | Discharge: 2020-11-27 | Disposition: A | Discharge status: Home / Self Care | Payer: Federal, State, Local not specified - PPO | Source: Ambulatory Visit | Attending: Family | Admitting: Family

## 2020-11-27 ENCOUNTER — Encounter: Payer: Self-pay | Admitting: Family

## 2020-11-27 ENCOUNTER — Other Ambulatory Visit: Payer: Self-pay

## 2020-11-27 DIAGNOSIS — M810 Age-related osteoporosis without current pathological fracture: Secondary | ICD-10-CM | POA: Diagnosis not present

## 2020-11-27 DIAGNOSIS — Z780 Asymptomatic menopausal state: Secondary | ICD-10-CM | POA: Diagnosis not present

## 2020-11-27 DIAGNOSIS — Z8739 Personal history of other diseases of the musculoskeletal system and connective tissue: Secondary | ICD-10-CM | POA: Diagnosis not present

## 2020-11-29 ENCOUNTER — Telehealth: Payer: Self-pay | Admitting: Family

## 2020-11-29 NOTE — Telephone Encounter (Signed)
Please contact pt and let her know that her bone density shows severe bone thinning/osteoporosis.  I would strongly recommend that she start prolia. She was going to think about after her visit.   If she is agreeable, please initiate insurance approval.

## 2020-12-02 NOTE — Telephone Encounter (Signed)
Lvm again at home phone number for patient to call back about results

## 2020-12-02 NOTE — Telephone Encounter (Signed)
Called pt and lvm to return call.  

## 2020-12-03 ENCOUNTER — Telehealth: Payer: Self-pay

## 2020-12-03 NOTE — Telephone Encounter (Signed)
-----   Message from Wilford Corner, CMA sent at 12/03/2020  9:16 AM EST ----- Can you please check on insurance benefits on prolia for this patient please.

## 2020-12-03 NOTE — Telephone Encounter (Signed)
Benefits verified. Waiting on summary of benefits.

## 2020-12-03 NOTE — Telephone Encounter (Signed)
Advised patient of results, provider's comments and advise.  She will like to do some research on the prolia and she will let us know if she will like to proceed with injection.  In the mean time will have Fredric Mare B. Check on insurance coverage.

## 2020-12-14 ENCOUNTER — Encounter: Payer: Self-pay | Admitting: General Practice

## 2020-12-14 ENCOUNTER — Other Ambulatory Visit (HOSPITAL_COMMUNITY)
Admission: RE | Admit: 2020-12-14 | Discharge: 2020-12-14 | Disposition: A | Discharge status: Home / Self Care | Payer: Federal, State, Local not specified - PPO | Source: Ambulatory Visit | Attending: Obstetrics & Gynecology | Admitting: Obstetrics & Gynecology

## 2020-12-14 ENCOUNTER — Other Ambulatory Visit: Payer: Self-pay

## 2020-12-14 ENCOUNTER — Encounter: Payer: Self-pay | Admitting: Obstetrics & Gynecology

## 2020-12-14 ENCOUNTER — Ambulatory Visit (INDEPENDENT_AMBULATORY_CARE_PROVIDER_SITE_OTHER): Payer: Federal, State, Local not specified - PPO | Admitting: Obstetrics & Gynecology

## 2020-12-14 VITALS — BP 123/73 | HR 81 | Ht 63.5 in | Wt 112.0 lb

## 2020-12-14 DIAGNOSIS — Z01419 Encounter for gynecological examination (general) (routine) without abnormal findings: Secondary | ICD-10-CM

## 2020-12-14 DIAGNOSIS — N6310 Unspecified lump in the right breast, unspecified quadrant: Secondary | ICD-10-CM

## 2020-12-14 NOTE — Progress Notes (Signed)
Subjective:     Amy [name] is a 70 y.o. female here for a routine exam.  Current complaints: none. Pt walks 8 miles per day regardless of the weather.  She has a colonoscopy 2 years prev.       Gynecologic History No LMP recorded (exact date). Patient is postmenopausal. Contraception: post menopausal status Last Pap: 06/2019. Results were: normal Last mammogram: 06/19/2020. Results were: normal  Obstetric History OB History  Gravida Para Term Preterm AB Living  2 2          SAB IAB Ectopic Multiple Live Births          2    # Outcome Date GA Lbr Len/2nd Weight Sex Delivery Anes PTL Lv  2 Para      Vag-Spont     1 Para      Vag-Spont       The following portions of the patient's history were reviewed and updated as appropriate: allergies, current medications, past family history, past medical history, past social history, past surgical history and problem list.  Review of Systems Pertinent items are noted in HPI.    Objective:  BP 123/73   Pulse 81   Ht 5' 3.5" (1.613 m)   Wt 112 lb (50.8 kg)   LMP  (Exact Date)   BMI 19.53 kg/m  General Appearance:    Alert, cooperative, no distress, appears stated age  Head:    Normocephalic, without obvious abnormality, atraumatic  Eyes:    conjunctiva/corneas clear, EOM's intact, both eyes  Ears:    Normal external ear canals, both ears  Nose:   Nares normal, septum midline, mucosa normal, no drainage    or sinus tenderness  Throat:   Lips, mucosa, and tongue normal; teeth and gums normal  Neck:   Supple, symmetrical, trachea midline, no adenopathy;    thyroid:  no enlargement/tenderness/nodules  Back:     Symmetric, no curvature, ROM normal, no CVA tenderness  Lungs:     respirations unlabored  Chest Wall:    No tenderness or deformity   Heart:    Regular rate and rhythm  Breast Exam:    Pt has breast implants bilaterally. On the right side there is a well circumscribes mass at 9 o'clock. This is mobile and is NOT attached to the  implant.    Abdomen:     Soft, non-tender, bowel sounds active all four quadrants,    no masses, no organomegaly  Genitalia:    Normal female without lesion, discharge or tenderness     Extremities:   Extremities normal, atraumatic, no cyanosis or edema  Pulses:   2+ and symmetric all extremities  Skin:   Skin color, texture, turgor normal, no rashes or lesions       Assessment:    Healthy female exam.   Breast mass on right side. This is NOT related to the implants and needs to be eval.    Plan:     Amy [name] was seen today for gynecologic exam.  Diagnoses and all orders for this visit:  Well female exam with routine gynecological exam -     Cytology - PAP( Amy [name])  Breast mass, right -     MM Digital Diagnostic Unilat R; Future -     US BREAST LTD UNI RIGHT INC AXILLA; Future  will have pt f/u in 4 weeks just to make sure that the above breast issue has been fully evaluated.    Amy [name],  M.D., Amy [name]

## 2020-12-14 NOTE — Progress Notes (Signed)
Patient presents for annual exam.  Patient is up to date on bone density (Dec '21 and mammogram (July '21)

## 2020-12-15 ENCOUNTER — Telehealth: Payer: Self-pay | Admitting: General Practice

## 2020-12-15 NOTE — Telephone Encounter (Signed)
Left message informing patient of [place] at [facility].  Pt is scheduled on 01/20/2021 at 12:50pm arriving by 12:40pm.  Asked patient to give our office a call with any questions or concerns.

## 2020-12-20 LAB — CYTOLOGY - PAP
Comment: NEGATIVE
Diagnosis: NEGATIVE
High risk HPV: NEGATIVE

## 2021-01-11 ENCOUNTER — Ambulatory Visit: Payer: Federal, State, Local not specified - PPO | Admitting: Obstetrics & Gynecology

## 2021-01-20 ENCOUNTER — Ambulatory Visit
Admission: RE | Admit: 2021-01-20 | Discharge: 2021-01-20 | Disposition: A | Discharge status: Home / Self Care | Payer: Federal, State, Local not specified - PPO | Source: Ambulatory Visit | Attending: Obstetrics & Gynecology | Admitting: Obstetrics & Gynecology

## 2021-01-20 ENCOUNTER — Other Ambulatory Visit: Payer: Self-pay

## 2021-01-20 DIAGNOSIS — N6310 Unspecified lump in the right breast, unspecified quadrant: Secondary | ICD-10-CM

## 2021-01-20 DIAGNOSIS — R928 Other abnormal and inconclusive findings on diagnostic imaging of breast: Secondary | ICD-10-CM | POA: Diagnosis not present

## 2021-01-20 DIAGNOSIS — N6489 Other specified disorders of breast: Secondary | ICD-10-CM | POA: Diagnosis not present

## 2021-02-01 ENCOUNTER — Ambulatory Visit: Payer: Federal, State, Local not specified - PPO | Admitting: Obstetrics & Gynecology

## 2021-11-22 ENCOUNTER — Telehealth: Payer: Self-pay | Admitting: Family

## 2021-11-22 DIAGNOSIS — R739 Hyperglycemia, unspecified: Secondary | ICD-10-CM

## 2021-11-22 DIAGNOSIS — D75839 Thrombocytosis, unspecified: Secondary | ICD-10-CM

## 2021-11-22 NOTE — Telephone Encounter (Signed)
Pt was requesting orders for labs so she can do them sometime this week in the morning before her cpe. Please advise.

## 2021-11-23 NOTE — Telephone Encounter (Signed)
Labs scheduled for tomorrow am

## 2021-11-23 NOTE — Telephone Encounter (Signed)
Yes, future orders placed

## 2021-11-24 ENCOUNTER — Other Ambulatory Visit (INDEPENDENT_AMBULATORY_CARE_PROVIDER_SITE_OTHER): Payer: Federal, State, Local not specified - PPO

## 2021-11-24 DIAGNOSIS — D75839 Thrombocytosis, unspecified: Secondary | ICD-10-CM | POA: Diagnosis not present

## 2021-11-24 DIAGNOSIS — R739 Hyperglycemia, unspecified: Secondary | ICD-10-CM

## 2021-11-24 LAB — CBC WITH DIFFERENTIAL/PLATELET
Basophils Absolute: 0 10*3/uL (ref 0.0–0.1)
Basophils Relative: 0.1 % (ref 0.0–3.0)
Eosinophils Absolute: 0 10*3/uL (ref 0.0–0.7)
Eosinophils Relative: 0.5 % (ref 0.0–5.0)
HCT: 39.9 % (ref 36.0–46.0)
Hemoglobin: 13.2 g/dL (ref 12.0–15.0)
Lymphocytes Relative: 8.4 % — ABNORMAL LOW (ref 12.0–46.0)
Lymphs Abs: 0.5 10*3/uL — ABNORMAL LOW (ref 0.7–4.0)
MCHC: 33.2 g/dL (ref 30.0–36.0)
MCV: 85.6 fl (ref 78.0–100.0)
Monocytes Absolute: 0.3 10*3/uL (ref 0.1–1.0)
Monocytes Relative: 4.3 % (ref 3.0–12.0)
Neutro Abs: 5.5 10*3/uL (ref 1.4–7.7)
Neutrophils Relative %: 86.7 % — ABNORMAL HIGH (ref 43.0–77.0)
Platelets: 420 10*3/uL — ABNORMAL HIGH (ref 150.0–400.0)
RBC: 4.66 Mil/uL (ref 3.87–5.11)
RDW: 13.9 % (ref 11.5–15.5)
WBC: 6.4 10*3/uL (ref 4.0–10.5)

## 2021-11-24 LAB — COMPREHENSIVE METABOLIC PANEL
ALT: 15 U/L (ref 0–35)
AST: 23 U/L (ref 0–37)
Albumin: 4.3 g/dL (ref 3.5–5.2)
Alkaline Phosphatase: 57 U/L (ref 39–117)
BUN: 20 mg/dL (ref 6–23)
CO2: 30 mEq/L (ref 19–32)
Calcium: 9.3 mg/dL (ref 8.4–10.5)
Chloride: 98 mEq/L (ref 96–112)
Creatinine, Ser: 0.66 mg/dL (ref 0.40–1.20)
GFR: 90.22 mL/min (ref >60.00)
Glucose, Bld: 92 mg/dL (ref 70–99)
Potassium: 4.5 mEq/L (ref 3.5–5.1)
Sodium: 135 mEq/L (ref 135–145)
Total Bilirubin: 0.8 mg/dL (ref 0.2–1.2)
Total Protein: 6.9 g/dL (ref 6.0–8.3)

## 2021-11-26 ENCOUNTER — Ambulatory Visit (INDEPENDENT_AMBULATORY_CARE_PROVIDER_SITE_OTHER): Payer: Federal, State, Local not specified - PPO | Admitting: Family

## 2021-11-26 ENCOUNTER — Encounter: Payer: Self-pay | Admitting: Family

## 2021-11-26 VITALS — BP 111/60 | HR 95 | Temp 98.0°F | Resp 16 | Ht 63.0 in | Wt 112.4 lb

## 2021-11-26 DIAGNOSIS — Z0000 Encounter for general adult medical examination without abnormal findings: Secondary | ICD-10-CM

## 2021-11-26 NOTE — Assessment & Plan Note (Signed)
Encouraged pt to continue healthy diet, exercise.  She will send me information on the pneumonia shot she got at [facility].  Pap up to date.  Colo and mammo up to date.

## 2021-11-26 NOTE — Progress Notes (Signed)
Subjective:   By signing my name below, I, Amy [name], attest that this documentation has been prepared under the direction and in the presence of Amy Craze, NP, 11/26/2021    Patient ID: Amy [name], female    DOB: [birth year], 70 y.o.   MRN: [id number]  Chief Complaint  Patient presents with   Annual Exam    Here for Annual Exam    HPI Patient is in today for a comprehensive physical exam.   She denies any fever, unexpected weight change, adenopathy, rash, hearing loss, ear pain, rhinorrhea, visual disturbances, eye pain, chest pain, leg swelling, cough, nausea, vomitting, diarrhea, blood in stool, dysuria, frequency, myalgias, arthralgias, headaches, depression or anxiety.   Immunizations: She has had her flu shot this season. She has had both shingrix vaccines and her pneumonia vaccine. She is interested in getting the pneumonia booster when she is eligible. She has had 4 Pfizer Covid-19 vaccines at this time. She is UTD on tetanus at this time.  Diet: She mentions she maintains a healthy diet. Exercise: She is very active. She walks 8 miles everyday.  Colonoscopy: Last performed on 08/26/2019 and results showed a few small-mouthed diverticula in the sigmoid colon. Everything else appeared normal. Repeat colonoscopy not necessary.  Dexa: A dexa scan was last performed on 11/27/2020 and results measured at [facility] L1-L4 is 0.811 g/cm2 with a T-score of -3.1. She is osteopenic according to WHO criteria. Repeat after 2 years.  Pap Smear: Last performed on 12/14/2020 and results were normal. Repeat every 3 years.  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 Everything else appeared normal. She had a bilateral mammogram performed on 07/11/2020 and the results were normal. Mammogram needs to be repeated for both breasts a year from the 07/11/2020 date. Due. Dental: She is UTD on  dental care. Vision: She is UTD on vision care. SHx: She denies any changes to her surgical history in the last year.  FMHx: She reports no new changes to her family medical history.   Health Maintenance Due  Topic Date Due   Hepatitis C Screening  Never done   Pneumonia Vaccine 14+ Years old (2 - PCV) 07/18/2020    Past Medical History:  Diagnosis Date   Fracture    ankle fracture 5/11   GERD (gastroesophageal reflux disease)    Osteopenia    Osteoporosis 12/22/2014   Post-operative nausea and vomiting     Past Surgical History:  Procedure Laterality Date   AUGMENTATION MAMMAPLASTY Bilateral    COLONOSCOPY  11/23/2007   FRACTURE SURGERY  05/11/2010   lft ankle ORIF   FRACTURE SURGERY Left 01/19/16   left arm fracture   HERNIA REPAIR  2009   umbilical hernia/bilat inguinal repair    Family History  Problem Relation Age of Onset   Kidney failure Father    CAD Father    Arthritis Sister    Colon polyps Sister    Cancer Sister        uterine cancer   Breast cancer Paternal Uncle    Colon cancer Neg Hx    Esophageal cancer Neg Hx    Stomach cancer Neg Hx    Rectal cancer Neg Hx     Social History   Socioeconomic History   Marital status: Widowed    Spouse name: Not on file   Number of children: 2   Years of education: Not on file  Highest education level: Not on file  Occupational History   Occupation: Marine scientist: [employer]  Tobacco Use   Smoking status: Never   Smokeless tobacco: Never  Vaping Use   Vaping Use: Never used  Substance and Sexual Activity   Alcohol use: No   Drug use: No   Sexual activity: Not on file  Other Topics Concern   Not on file  Social History Narrative   Regular exercise:  Walks daily   Caffeine use:  2 cups coffee daily   Widowed- husband died in 04-01-11   She has 2 daughters   No grand children   Works at the post office   Social Determinants of Corporate investment banker Strain: Not on file  Food  Insecurity: Not on file  Transportation Needs: Not on file  Physical Activity: Not on file  Stress: Not on file  Social Connections: Not on file  Intimate Partner Violence: Not on file    Outpatient Medications Prior to Visit  Medication Sig Dispense Refill   Calcium Carbonate-Vitamin D 600-400 MG-UNIT per chew tablet Chew 1 tablet by mouth 2 (two) times daily.     No facility-administered medications prior to visit.    Allergies  Allergen Reactions   Alendronate Sodium Other (See Comments)    Tightness in throat and chest.   Codeine Nausea Only    Review of Systems  Constitutional:  Negative for fever.       (-) unexpected weight changes (-) adenopathy  HENT:  Negative for ear pain and hearing loss.        (-) rhinorrhea  Eyes:  Negative for pain.       (-) visual disturbances  Respiratory:  Negative for cough.   Cardiovascular:  Negative for chest pain and leg swelling.  Gastrointestinal:  Negative for blood in stool, diarrhea, nausea and vomiting.  Genitourinary:  Negative for dysuria and frequency.  Musculoskeletal:  Negative for joint pain and myalgias.  Skin:  Negative for rash.  Neurological:  Negative for headaches.  Psychiatric/Behavioral:  Negative for depression. The patient is not nervous/anxious.       Objective:    Physical Exam Constitutional:      General: She is not in acute distress.    Appearance: Normal appearance. She is not ill-appearing.  HENT:     Head: Normocephalic and atraumatic.     Right Ear: Tympanic membrane, ear canal and external ear normal.     Left Ear: Tympanic membrane, ear canal and external ear normal.  Eyes:     Extraocular Movements: Extraocular movements intact.     Pupils: Pupils are equal, round, and reactive to light.     Comments: (-) nystagmus  Cardiovascular:     Rate and Rhythm: Normal rate and regular rhythm.     Heart sounds: Normal heart sounds. No murmur heard.   No gallop.  Pulmonary:     Effort:  Pulmonary effort is normal. No respiratory distress.     Breath sounds: Normal breath sounds. No wheezing or rales.  Abdominal:     General: Bowel sounds are normal.     Palpations: Abdomen is soft.     Tenderness: There is no abdominal tenderness.  Musculoskeletal:     Comments: (+) 5/5 upper and lower extremity strength  Lymphadenopathy:     Cervical: No cervical adenopathy.  Skin:    General: Skin is warm and dry.  Neurological:     Mental Status: She  is alert and oriented to person, place, and time.     Deep Tendon Reflexes:     Reflex Scores:      Patellar reflexes are 3+ on the right side and 3+ on the left side. Psychiatric:        Behavior: Behavior normal.        Judgment: Judgment normal.    BP 111/60 (BP Location: Right Arm, Patient Position: Sitting, Cuff Size: Small)    Pulse 95    Temp 98 F (36.7 C) (Oral)    Resp 16    Ht 5\' 3"  (1.6 m)    Wt 112 lb 6.4 oz (51 kg)    SpO2 99%    BMI 19.91 kg/m  Wt Readings from Last 3 Encounters:  11/26/21 112 lb 6.4 oz (51 kg)  12/14/20 112 lb (50.8 kg)  11/20/20 111 lb 6.4 oz (50.5 kg)       Assessment & Plan:   Problem List Items Addressed This Visit       Unprioritized   Routine general medical examination at a health care facility    Encouraged pt to continue healthy diet, exercise.  She will send me information on the pneumonia shot she got at [facility].  Pap up to date.  Colo and mammo up to date.       Other Visit Diagnoses     Preventative health care    -  Primary   Relevant Orders   MM 3D SCREEN BREAST BILATERAL      No orders of the defined types were placed in this encounter.   I, 14/10/21, NP, personally preformed the services described in this documentation.  All medical record entries made by the scribe were at my direction and in my presence.  I have reviewed the chart and discharge instructions (if applicable) and agree that the record reflects my personal performance and is accurate and  complete. 11/26/2021  I,Amy [name],acting as a 11/28/2021 for Neurosurgeon, NP.,have documented all relevant documentation on the behalf of Lemont Fillers, NP,as directed by  Lemont Fillers, NP while in the presence of Lemont Fillers, NP.  Lemont Fillers, NP

## 2021-11-26 NOTE — Patient Instructions (Signed)
Follow up in one year for next annual physical exam.

## 2021-12-15 ENCOUNTER — Ambulatory Visit (HOSPITAL_BASED_OUTPATIENT_CLINIC_OR_DEPARTMENT_OTHER)
Admission: RE | Admit: 2021-12-15 | Discharge: 2021-12-15 | Disposition: A | Discharge status: Home / Self Care | Payer: Federal, State, Local not specified - PPO | Source: Ambulatory Visit | Attending: Family | Admitting: Family

## 2021-12-15 ENCOUNTER — Other Ambulatory Visit: Payer: Self-pay

## 2021-12-15 ENCOUNTER — Encounter (HOSPITAL_BASED_OUTPATIENT_CLINIC_OR_DEPARTMENT_OTHER): Payer: Self-pay

## 2021-12-15 DIAGNOSIS — Z1231 Encounter for screening mammogram for malignant neoplasm of breast: Secondary | ICD-10-CM | POA: Diagnosis not present

## 2021-12-15 DIAGNOSIS — Z0000 Encounter for general adult medical examination without abnormal findings: Secondary | ICD-10-CM | POA: Diagnosis not present

## 2022-01-12 ENCOUNTER — Other Ambulatory Visit (HOSPITAL_BASED_OUTPATIENT_CLINIC_OR_DEPARTMENT_OTHER): Payer: Self-pay

## 2022-01-12 MED ORDER — AMOXICILLIN 500 MG PO CAPS
500.0000 mg | ORAL_CAPSULE | Freq: Three times a day (TID) | ORAL | 0 refills | Status: DC
  Filled 2022-01-12: qty 30, 10d supply, fill #0

## 2022-01-25 ENCOUNTER — Telehealth: Payer: Self-pay | Admitting: Family

## 2022-01-25 NOTE — Telephone Encounter (Signed)
-----   Message from Dierdre Searles, [place] sent at 01/24/2022  6:31 PM EST ----- Regarding: Prolia Good Evening,  I saw this pt in the Prolia book for Feb and wanted to touch base. Looks like Prolia was discussed the end of 2021 but pt did not receive injection. Would you like for me to pursue benefits for this year?  Gearldine Bienenstock

## 2022-01-25 NOTE — Telephone Encounter (Signed)
Last time I spoke to the patient she was going to think about it and get back to me. I am going to reach back out to the patient and will let you know. Thanks!

## 2022-01-31 ENCOUNTER — Other Ambulatory Visit (HOSPITAL_BASED_OUTPATIENT_CLINIC_OR_DEPARTMENT_OTHER): Payer: Self-pay

## 2022-01-31 MED ORDER — AMOXICILLIN 500 MG PO CAPS
ORAL_CAPSULE | ORAL | 0 refills | Status: DC
  Filled 2022-01-31: qty 30, 10d supply, fill #0

## 2022-02-07 ENCOUNTER — Other Ambulatory Visit (HOSPITAL_BASED_OUTPATIENT_CLINIC_OR_DEPARTMENT_OTHER): Payer: Self-pay

## 2022-02-07 MED ORDER — HYDROCODONE-ACETAMINOPHEN 5-325 MG PO TABS
ORAL_TABLET | ORAL | 0 refills | Status: DC
  Filled 2022-02-07: qty 18, 4d supply, fill #0

## 2022-09-29 ENCOUNTER — Other Ambulatory Visit (HOSPITAL_BASED_OUTPATIENT_CLINIC_OR_DEPARTMENT_OTHER): Payer: Self-pay

## 2022-09-29 MED ORDER — HYDROCODONE-ACETAMINOPHEN 5-325 MG PO TABS
ORAL_TABLET | ORAL | 0 refills | Status: DC
  Filled 2022-09-29: qty 12, 2d supply, fill #0

## 2022-09-29 MED ORDER — AMOXICILLIN 500 MG PO CAPS
ORAL_CAPSULE | ORAL | 0 refills | Status: DC
  Filled 2022-09-29: qty 21, 7d supply, fill #0

## 2022-09-30 DIAGNOSIS — Z23 Encounter for immunization: Secondary | ICD-10-CM | POA: Diagnosis not present

## 2022-11-28 ENCOUNTER — Encounter: Payer: Self-pay | Admitting: Family

## 2022-11-28 ENCOUNTER — Ambulatory Visit (INDEPENDENT_AMBULATORY_CARE_PROVIDER_SITE_OTHER): Payer: Federal, State, Local not specified - PPO | Admitting: Family

## 2022-11-28 VITALS — BP 119/62 | HR 82 | Temp 98.4°F | Resp 16 | Ht 64.0 in | Wt 114.0 lb

## 2022-11-28 DIAGNOSIS — Z0000 Encounter for general adult medical examination without abnormal findings: Secondary | ICD-10-CM | POA: Diagnosis not present

## 2022-11-28 DIAGNOSIS — Z1159 Encounter for screening for other viral diseases: Secondary | ICD-10-CM | POA: Diagnosis not present

## 2022-11-28 DIAGNOSIS — Z23 Encounter for immunization: Secondary | ICD-10-CM | POA: Diagnosis not present

## 2022-11-28 DIAGNOSIS — M810 Age-related osteoporosis without current pathological fracture: Secondary | ICD-10-CM

## 2022-11-28 LAB — CBC WITH DIFFERENTIAL/PLATELET
Basophils Absolute: 0.1 10*3/uL (ref 0.0–0.1)
Basophils Relative: 1 % (ref 0.0–3.0)
Eosinophils Absolute: 0.1 10*3/uL (ref 0.0–0.7)
Eosinophils Relative: 0.9 % (ref 0.0–5.0)
HCT: 40.4 % (ref 36.0–46.0)
Hemoglobin: 13.6 g/dL (ref 12.0–15.0)
Lymphocytes Relative: 19.9 % (ref 12.0–46.0)
Lymphs Abs: 1.3 10*3/uL (ref 0.7–4.0)
MCHC: 33.7 g/dL (ref 30.0–36.0)
MCV: 85.5 fl (ref 78.0–100.0)
Monocytes Absolute: 0.4 10*3/uL (ref 0.1–1.0)
Monocytes Relative: 6.3 % (ref 3.0–12.0)
Neutro Abs: 4.8 10*3/uL (ref 1.4–7.7)
Neutrophils Relative %: 71.9 % (ref 43.0–77.0)
Platelets: 503 10*3/uL — ABNORMAL HIGH (ref 150.0–400.0)
RBC: 4.72 Mil/uL (ref 3.87–5.11)
RDW: 13.8 % (ref 11.5–15.5)
WBC: 6.7 10*3/uL (ref 4.0–10.5)

## 2022-11-28 LAB — COMPREHENSIVE METABOLIC PANEL
ALT: 16 U/L (ref 0–35)
AST: 24 U/L (ref 0–37)
Albumin: 4.6 g/dL (ref 3.5–5.2)
Alkaline Phosphatase: 62 U/L (ref 39–117)
BUN: 13 mg/dL (ref 6–23)
CO2: 30 mEq/L (ref 19–32)
Calcium: 9.7 mg/dL (ref 8.4–10.5)
Chloride: 98 mEq/L (ref 96–112)
Creatinine, Ser: 0.7 mg/dL (ref 0.40–1.20)
GFR: 88.32 mL/min (ref >60.00)
Glucose, Bld: 93 mg/dL (ref 70–99)
Potassium: 5.2 mEq/L — ABNORMAL HIGH (ref 3.5–5.1)
Sodium: 136 mEq/L (ref 135–145)
Total Bilirubin: 0.4 mg/dL (ref 0.2–1.2)
Total Protein: 7 g/dL (ref 6.0–8.3)

## 2022-11-28 LAB — LIPID PANEL
Cholesterol: 175 mg/dL (ref 0–200)
HDL: 93 mg/dL (ref >39.00)
LDL Cholesterol: 72 mg/dL (ref 0–99)
NonHDL: 81.71
Total CHOL/HDL Ratio: 2
Triglycerides: 48 mg/dL (ref 0.0–149.0)
VLDL: 9.6 mg/dL (ref 0.0–40.0)

## 2022-11-28 LAB — TSH: TSH: 2.39 u[IU]/mL (ref 0.35–5.50)

## 2022-11-28 NOTE — Assessment & Plan Note (Addendum)
Continue regular walking and calcium. She declines fosamax or other treatment. Repeat dexa today.

## 2022-11-28 NOTE — Assessment & Plan Note (Addendum)
Continue healthy diet, regular exercise. Refer for mammogram. Prevnar 20 today.  Colo up to date.

## 2022-11-28 NOTE — Addendum Note (Signed)
Addended by: Wilford Corner on: 11/28/2022 10:16 AM   Modules accepted: Orders

## 2022-11-28 NOTE — Progress Notes (Signed)
Subjective:   By signing my name below, I, Luna Glasgow, attest that this documentation has been prepared under the direction and in the presence of Debbrah Alar, 11/28/2022.   Patient ID: Amy [name], female    DOB: 04-Sep-1951, 71 y.o.   MRN: [id number]  Chief Complaint  Patient presents with   Annual Exam    HPI Patient is in today for a comprehensive physical exam.  She denies new moles, itching, chills, fever, hearing loss, sinus pain, congestion, sore throat, cough and hemoptysis, chest pain, palpitations, wheezing, constipation, diarrhea, blood in stool, nausea and vomiting, dysuria, frequency, hematuria, myalgias and joint pain, depression, anxiety.  Hepatitis C Patient is receiving a hepatitis C screening today.   Calcium deficiency  Patient is complaint with 600-400 mg Calcium supplements.  Osteoporosis Patient reports that she no longer takes fosamax because she does not like the side effects or the way it made her feel.   Social history- There are no new surgeries to report. Colonoscopy - last completed on 08/26/2019. Dexa last completed on 11/27/2020. Immunizations- Patient is receiving an pneumonia vaccine this visit. Pap smear- last completed on 12/14/2020. Mammogram- last completed on 01/20/2021. Exercise-She reports that she walks everyday and had walked 4 miles before today's appointment. Vision - She is UTD on vision care. Dental - She is UTD on dental care.   Health Maintenance Due  Topic Date Due   Hepatitis C Screening  Never done   Pneumonia Vaccine 88+ Years old (2 - PCV) 07/18/2020   COVID-19 Vaccine (6 - 2023-24 season) 11/07/2022    Past Medical History:  Diagnosis Date   Fracture    ankle fracture 5/11   GERD (gastroesophageal reflux disease)    Osteopenia    Osteoporosis 12/22/2014   Post-operative nausea and vomiting     Past Surgical History:  Procedure Laterality Date   AUGMENTATION MAMMAPLASTY Bilateral    COLONOSCOPY   11/23/2007   FRACTURE SURGERY  05/11/2010   lft ankle ORIF   FRACTURE SURGERY Left 01/19/16   left arm fracture   HERNIA REPAIR  1191   umbilical hernia/bilat inguinal repair    Family History  Problem Relation Age of Onset   Kidney failure Father    CAD Father    Arthritis Sister    Colon polyps Sister    Cancer Sister        uterine cancer   Breast cancer Paternal Uncle    Colon cancer Neg Hx    Esophageal cancer Neg Hx    Stomach cancer Neg Hx    Rectal cancer Neg Hx     Social History   Socioeconomic History   Marital status: Widowed    Spouse name: Not on file   Number of children: 2   Years of education: Not on file   Highest education level: Not on file  Occupational History   Occupation: Armed forces operational officer: [employer]  Tobacco Use   Smoking status: Never   Smokeless tobacco: Never  Vaping Use   Vaping Use: Never used  Substance and Sexual Activity   Alcohol use: No   Drug use: No   Sexual activity: Not on file  Other Topics Concern   Not on file  Social History Narrative   Regular exercise:  Walks daily   Caffeine use:  2 cups coffee daily   Widowed- husband died in 02-08-2011   She has 2 daughters   No grand children   Works at  the post office   Social Determinants of Health   Financial Resource Strain: Not on file  Food Insecurity: Not on file  Transportation Needs: Not on file  Physical Activity: Not on file  Stress: Not on file  Social Connections: Not on file  Intimate Partner Violence: Not on file    Outpatient Medications Prior to Visit  Medication Sig Dispense Refill   amoxicillin (AMOXIL) 500 MG capsule Take 1 capsule by mouth 3 times daily for 10 days 30 capsule 0   amoxicillin (AMOXIL) 500 MG capsule Take 1 capsule by mouth three times daily 21 capsule 0   Calcium Carbonate-Vitamin D 600-400 MG-UNIT per chew tablet Chew 1 tablet by mouth 2 (two) times daily.     HYDROcodone-acetaminophen (NORCO/VICODIN) 5-325 MG tablet Take 1 tablet  by mouth every 4 - 6 hours as needed for pain 18 tablet 0   HYDROcodone-acetaminophen (NORCO/VICODIN) 5-325 MG tablet take 1 tablet by mouth every 4 to 6 hours as needed for pain 12 tablet 0   No facility-administered medications prior to visit.    Allergies  Allergen Reactions   Alendronate Sodium Other (See Comments)    Tightness in throat and chest.   Codeine Nausea Only    Review of Systems  Constitutional:  Negative for chills and fever.  HENT:  Negative for congestion, sinus pain and sore throat.   Respiratory:  Negative for cough, hemoptysis and wheezing.   Cardiovascular:  Negative for chest pain and palpitations.  Gastrointestinal:  Negative for blood in stool, constipation, diarrhea, nausea and vomiting.  Genitourinary:  Negative for dysuria, frequency and hematuria.  Musculoskeletal:  Negative for joint pain and myalgias.  Skin:  Negative for itching.       (-) new moles  Psychiatric/Behavioral:  Negative for depression. The patient is not nervous/anxious.        Objective:    Physical Exam Constitutional:      General: She is not in acute distress.    Appearance: Normal appearance. She is not ill-appearing.  HENT:     Head: Normocephalic and atraumatic.     Right Ear: Tympanic membrane, ear canal and external ear normal.     Left Ear: Tympanic membrane, ear canal and external ear normal.  Eyes:     Extraocular Movements: Extraocular movements intact.     Pupils: Pupils are equal, round, and reactive to light.  Cardiovascular:     Rate and Rhythm: Normal rate and regular rhythm.     Heart sounds: Normal heart sounds. No murmur heard.    No gallop.  Pulmonary:     Effort: Pulmonary effort is normal. No respiratory distress.     Breath sounds: Normal breath sounds. No wheezing or rales.  Abdominal:     General: Bowel sounds are normal. There is no distension.     Palpations: Abdomen is soft.     Tenderness: There is no abdominal tenderness. There is no  guarding.  Musculoskeletal:     Comments: 5/5 upper and lower extremity strength   Skin:    General: Skin is warm and dry.  Neurological:     Mental Status: She is alert and oriented to person, place, and time.     Deep Tendon Reflexes:     Reflex Scores:      Patellar reflexes are 2+ on the right side and 2+ on the left side. Psychiatric:        Mood and Affect: Mood normal.  Behavior: Behavior normal.        Judgment: Judgment normal.     BP 119/62 (BP Location: Right Arm, Patient Position: Sitting, Cuff Size: Small)   Pulse 82   Temp 98.4 F (36.9 C) (Oral)   Resp 16   Ht 5' 4" (1.626 m)   Wt 114 lb (51.7 kg)   SpO2 100%   BMI 19.57 kg/m  Wt Readings from Last 3 Encounters:  11/28/22 114 lb (51.7 kg)  11/26/21 112 lb 6.4 oz (51 kg)  12/14/20 112 lb (50.8 kg)       Assessment & Plan:   Problem List Items Addressed This Visit       Unprioritized   Routine general medical examination at a health care facility    Continue healthy diet, regular exercise. Refer for mammogram. Prevnar 20 today.  Colo up to date.       Relevant Orders   MM 3D SCREEN BREAST W/IMPLANT BILATERAL   Osteoporosis    Continue regular walking and calcium. She declines fosamax or other treatment. Repeat dexa today.      Relevant Orders   DG Bone Density   Other Visit Diagnoses     Encounter for hepatitis C screening test for low risk patient    -  Primary   Relevant Orders   Hepatitis C Antibody   Preventative health care       Relevant Orders   Comp Met (CMET)   CBC w/Diff   TSH   Lipid panel      No orders of the defined types were placed in this encounter.   I, Debbrah Alar, personally preformed the services described in this documentation.  All medical record entries made by the scribe were at my direction and in my presence.  I have reviewed the chart and discharge instructions (if applicable) and agree that the record reflects my personal performance and is  accurate and complete. 11/28/2022.   I,Verona Buck,acting as a Education administrator for [employer], NP.,have documented all relevant documentation on the behalf of Amy Pear, NP,as directed by  Amy Pear, NP while in the presence of Amy Pear, NP.    Amy Pear, NP

## 2022-11-29 LAB — HEPATITIS C ANTIBODY: Hepatitis C Ab: NONREACTIVE

## 2022-12-07 ENCOUNTER — Encounter: Payer: Self-pay | Admitting: Family

## 2022-12-19 ENCOUNTER — Ambulatory Visit (HOSPITAL_BASED_OUTPATIENT_CLINIC_OR_DEPARTMENT_OTHER)
Admission: RE | Admit: 2022-12-19 | Discharge: 2022-12-19 | Disposition: A | Discharge status: Home / Self Care | Payer: Federal, State, Local not specified - PPO | Source: Ambulatory Visit | Attending: Family | Admitting: Family

## 2022-12-19 ENCOUNTER — Ambulatory Visit (HOSPITAL_BASED_OUTPATIENT_CLINIC_OR_DEPARTMENT_OTHER): Payer: Federal, State, Local not specified - PPO

## 2022-12-19 ENCOUNTER — Encounter (HOSPITAL_BASED_OUTPATIENT_CLINIC_OR_DEPARTMENT_OTHER): Payer: Self-pay

## 2022-12-19 DIAGNOSIS — M810 Age-related osteoporosis without current pathological fracture: Secondary | ICD-10-CM

## 2022-12-19 DIAGNOSIS — Z0000 Encounter for general adult medical examination without abnormal findings: Secondary | ICD-10-CM | POA: Diagnosis not present

## 2022-12-19 DIAGNOSIS — Z1231 Encounter for screening mammogram for malignant neoplasm of breast: Secondary | ICD-10-CM | POA: Diagnosis not present

## 2022-12-26 ENCOUNTER — Telehealth: Payer: Self-pay | Admitting: *Deleted

## 2022-12-26 NOTE — Telephone Encounter (Signed)
Patient stated that she will take vit d regularly.  She knows 3 people who has had problems with their jaw that takes prolia.  Patient decline to start.

## 2022-12-26 NOTE — Telephone Encounter (Signed)
-----  Message from Debbrah Alar, NP sent at 12/20/2022  7:57 AM EST ----- Bone density is still showing osteoporosis.  Did she give any thought to starting prolia injections twice a year for bone health?   ----- Message ----- From: Interface, Rad Results In Sent: 12/19/2022   2:07 PM EST To: Debbrah Alar, NP

## 2023-04-19 ENCOUNTER — Ambulatory Visit: Payer: Federal, State, Local not specified - PPO | Admitting: Family

## 2023-04-19 ENCOUNTER — Telehealth: Payer: Self-pay | Admitting: *Deleted

## 2023-04-19 ENCOUNTER — Other Ambulatory Visit (HOSPITAL_BASED_OUTPATIENT_CLINIC_OR_DEPARTMENT_OTHER): Payer: Self-pay

## 2023-04-19 VITALS — BP 125/83 | HR 93 | Temp 97.6°F | Resp 16 | Wt 116.0 lb

## 2023-04-19 DIAGNOSIS — G8929 Other chronic pain: Secondary | ICD-10-CM | POA: Diagnosis not present

## 2023-04-19 DIAGNOSIS — M25512 Pain in left shoulder: Secondary | ICD-10-CM | POA: Diagnosis not present

## 2023-04-19 DIAGNOSIS — E875 Hyperkalemia: Secondary | ICD-10-CM | POA: Diagnosis not present

## 2023-04-19 DIAGNOSIS — Z0000 Encounter for general adult medical examination without abnormal findings: Secondary | ICD-10-CM

## 2023-04-19 LAB — BASIC METABOLIC PANEL
BUN: 13 mg/dL (ref 6–23)
CO2: 28 mEq/L (ref 19–32)
Calcium: 9.4 mg/dL (ref 8.4–10.5)
Chloride: 100 mEq/L (ref 96–112)
Creatinine, Ser: 0.68 mg/dL (ref 0.40–1.20)
GFR: 88.69 mL/min (ref >60.00)
Glucose, Bld: 90 mg/dL (ref 70–99)
Potassium: 4.3 mEq/L (ref 3.5–5.1)
Sodium: 137 mEq/L (ref 135–145)

## 2023-04-19 MED ORDER — MELOXICAM 7.5 MG PO TABS
14.0000 mg | ORAL_TABLET | Freq: Two times a day (BID) | ORAL | 0 refills | Status: DC | PRN
Start: 2023-04-19 — End: 2023-12-01
  Filled 2023-04-19: qty 30, 8d supply, fill #0

## 2023-04-19 NOTE — Assessment & Plan Note (Addendum)
Uncontrolled. Suspect rotator cuff tendinitis.  Will rx with meloxicam, refer to orthopedics for further evaluation.

## 2023-04-19 NOTE — Assessment & Plan Note (Signed)
She was drinking a sports electrolyte drink the last time we checked her potassium.  She has since discontinued. Will check follow up BMET.

## 2023-04-19 NOTE — Telephone Encounter (Signed)
Pt is asking for referral to a dermatologist for an overall skin check and to look at some "skin tags".  Pt prefers [facility] or [facility] locations.
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
## 2023-05-01 DIAGNOSIS — M67912 Unspecified disorder of synovium and tendon, left shoulder: Secondary | ICD-10-CM | POA: Diagnosis not present

## 2023-05-10 ENCOUNTER — Encounter: Payer: Self-pay | Admitting: Obstetrics & Gynecology

## 2023-05-10 ENCOUNTER — Other Ambulatory Visit (HOSPITAL_COMMUNITY)
Admission: RE | Admit: 2023-05-10 | Discharge: 2023-05-10 | Disposition: A | Discharge status: Home / Self Care | Payer: Federal, State, Local not specified - PPO | Source: Ambulatory Visit | Attending: Obstetrics & Gynecology | Admitting: Obstetrics & Gynecology

## 2023-05-10 ENCOUNTER — Ambulatory Visit (INDEPENDENT_AMBULATORY_CARE_PROVIDER_SITE_OTHER): Payer: Federal, State, Local not specified - PPO | Admitting: Obstetrics & Gynecology

## 2023-05-10 VITALS — BP 125/67 | HR 80 | Ht 64.0 in | Wt 117.0 lb

## 2023-05-10 DIAGNOSIS — L570 Actinic keratosis: Secondary | ICD-10-CM | POA: Diagnosis not present

## 2023-05-10 DIAGNOSIS — L814 Other melanin hyperpigmentation: Secondary | ICD-10-CM | POA: Diagnosis not present

## 2023-05-10 DIAGNOSIS — Z01419 Encounter for gynecological examination (general) (routine) without abnormal findings: Secondary | ICD-10-CM | POA: Insufficient documentation

## 2023-05-10 DIAGNOSIS — N6311 Unspecified lump in the right breast, upper outer quadrant: Secondary | ICD-10-CM | POA: Diagnosis not present

## 2023-05-10 DIAGNOSIS — L821 Other seborrheic keratosis: Secondary | ICD-10-CM | POA: Diagnosis not present

## 2023-05-10 DIAGNOSIS — Z1339 Encounter for screening examination for other mental health and behavioral disorders: Secondary | ICD-10-CM | POA: Diagnosis not present

## 2023-05-10 DIAGNOSIS — D1801 Hemangioma of skin and subcutaneous tissue: Secondary | ICD-10-CM | POA: Diagnosis not present

## 2023-05-10 DIAGNOSIS — L720 Epidermal cyst: Secondary | ICD-10-CM | POA: Diagnosis not present

## 2023-05-10 NOTE — Progress Notes (Signed)
Subjective:     Amy [name] is a 72 y.o. female here for a routine exam.  Current complaints: no GYN or physicina complaints. Pt still walks 8+ miles per day. She is still working. Loves to be outdoors.      Gynecologic History No LMP recorded. Patient is postmenopausal. Contraception: post menopausal status Last Pap: 12/14/2020. Results were: normal Last mammogram: 12/19/2022. Results were: implants; birad 1  Obstetric History OB History  Gravida Para Term Preterm AB Living  2 2          SAB IAB Ectopic Multiple Live Births          2    # Outcome Date GA Lbr Len/2nd Weight Sex Delivery Anes PTL Lv  2 Para      Vag-Spont     1 Para      Vag-Spont        The following portions of the patient's history were reviewed and updated as appropriate: allergies, current medications, past family history, past medical history, past social history, past surgical history, and problem list.  Review of Systems Pertinent items are noted in HPI.    Objective:  BP 125/67   Pulse 80   Ht 5\' 4"  (1.626 m)   Wt 117 lb (53.1 kg)   BMI 20.08 kg/m  General Appearance:    Alert, cooperative, no distress, appears stated age  Head:    Normocephalic, without obvious abnormality, atraumatic  Eyes:    conjunctiva/corneas clear, EOM's intact, both eyes  Ears:    Normal external ear canals, both ears  Nose:   Nares normal, septum midline, mucosa normal, no drainage    or sinus tenderness  Throat:   Lips, mucosa, and tongue normal; teeth and gums normal  Neck:   Supple, symmetrical, trachea midline, no adenopathy;    thyroid:  no enlargement/tenderness/nodules  Back:     Symmetric, no curvature, ROM normal, no CVA tenderness  Lungs:     respirations unlabored  Chest Wall:    No tenderness or deformity   Heart:    Regular rate and rhythm  Breast Exam:    No tenderness, masses, or nipple abnormality; On the right side there is a well circumscribes mass at 9 o'clock. This is mobile and is NOT attached to  the implant.  THIS was noted 2 years prev!   Abdomen:     Soft, non-tender, bowel sounds active all four quadrants,    no masses, no organomegaly  Genitalia:    Normal female without lesion, discharge or tenderness     Extremities:   Extremities normal, atraumatic, no cyanosis or edema  Pulses:   2+ and symmetric all extremities  Skin:   Skin color, texture, turgor normal, no rashes or lesions    Assessment:    Healthy female exam.   Stable mass on right breast. Pt was told that it was related to the implant. Which was placed before 1987. Has had normal mammograms.   Plan:   Amy [name] was seen today for gynecologic exam.  Diagnoses and all orders for this visit:  Well female exam with routine gynecological exam  Mass of upper outer quadrant of right breast  F/u PAP Rec that pt f/u if she notes any clinical changes or in 1 year.   [name] L. Harraway-Smith, M.D., [place]

## 2023-05-10 NOTE — Addendum Note (Signed)
Addended by: Anell Barr on: 05/10/2023 03:10 PM   Modules accepted: Orders

## 2023-05-10 NOTE — Progress Notes (Signed)
Patient had bone density and mammogram in Jan 2024

## 2023-05-16 LAB — CYTOLOGY - PAP
Comment: NEGATIVE
Diagnosis: NEGATIVE
High risk HPV: NEGATIVE

## 2023-05-29 DIAGNOSIS — M67912 Unspecified disorder of synovium and tendon, left shoulder: Secondary | ICD-10-CM | POA: Diagnosis not present

## 2023-08-22 ENCOUNTER — Other Ambulatory Visit (HOSPITAL_BASED_OUTPATIENT_CLINIC_OR_DEPARTMENT_OTHER): Payer: Self-pay

## 2023-08-22 MED ORDER — AMOXICILLIN 500 MG PO CAPS
500.0000 mg | ORAL_CAPSULE | Freq: Three times a day (TID) | ORAL | 0 refills | Status: DC
  Filled 2023-08-22: qty 30, 10d supply, fill #0

## 2023-10-10 ENCOUNTER — Telehealth: Payer: Self-pay | Admitting: Family

## 2023-10-10 NOTE — Telephone Encounter (Signed)
Patient notified of this information and she is ok to come in at scheduled appointment time

## 2023-10-10 NOTE — Telephone Encounter (Signed)
Pt r/s CPE due to PCP schedule change. Pt wants to know if they can get labs before their visit at 7 in the morning.Pls advise if possible.

## 2023-10-10 NOTE — Telephone Encounter (Signed)
I reviewed her chart and the labs that I will need to order do not need to be done fasting.  I am happy to do it when she comes in after breakfast.

## 2023-12-01 ENCOUNTER — Encounter: Payer: Self-pay | Admitting: Family

## 2023-12-01 ENCOUNTER — Encounter: Payer: Federal, State, Local not specified - PPO | Admitting: Family

## 2023-12-01 ENCOUNTER — Ambulatory Visit (INDEPENDENT_AMBULATORY_CARE_PROVIDER_SITE_OTHER): Payer: Federal, State, Local not specified - PPO | Admitting: Family

## 2023-12-01 ENCOUNTER — Telehealth: Payer: Self-pay | Admitting: *Deleted

## 2023-12-01 VITALS — BP 117/62 | HR 77 | Temp 98.7°F | Resp 16 | Ht 64.0 in | Wt 113.0 lb

## 2023-12-01 DIAGNOSIS — L309 Dermatitis, unspecified: Secondary | ICD-10-CM

## 2023-12-01 DIAGNOSIS — M810 Age-related osteoporosis without current pathological fracture: Secondary | ICD-10-CM | POA: Diagnosis not present

## 2023-12-01 DIAGNOSIS — Z0000 Encounter for general adult medical examination without abnormal findings: Secondary | ICD-10-CM

## 2023-12-01 DIAGNOSIS — D75839 Thrombocytosis, unspecified: Secondary | ICD-10-CM

## 2023-12-01 DIAGNOSIS — Z1231 Encounter for screening mammogram for malignant neoplasm of breast: Secondary | ICD-10-CM | POA: Diagnosis not present

## 2023-12-01 DIAGNOSIS — E875 Hyperkalemia: Secondary | ICD-10-CM

## 2023-12-01 LAB — COMPREHENSIVE METABOLIC PANEL
ALT: 17 U/L (ref 0–35)
AST: 27 U/L (ref 0–37)
Albumin: 4.5 g/dL (ref 3.5–5.2)
Alkaline Phosphatase: 62 U/L (ref 39–117)
BUN: 17 mg/dL (ref 6–23)
CO2: 30 meq/L (ref 19–32)
Calcium: 9.6 mg/dL (ref 8.4–10.5)
Chloride: 97 meq/L (ref 96–112)
Creatinine, Ser: 0.65 mg/dL (ref 0.40–1.20)
GFR: 89.28 mL/min (ref >60.00)
Glucose, Bld: 94 mg/dL (ref 70–99)
Potassium: 5.1 meq/L (ref 3.5–5.1)
Sodium: 134 meq/L — ABNORMAL LOW (ref 135–145)
Total Bilirubin: 0.6 mg/dL (ref 0.2–1.2)
Total Protein: 6.9 g/dL (ref 6.0–8.3)

## 2023-12-01 LAB — CBC WITH DIFFERENTIAL/PLATELET
Basophils Absolute: 0 10*3/uL (ref 0.0–0.1)
Basophils Relative: 0.6 % (ref 0.0–3.0)
Eosinophils Absolute: 0.1 10*3/uL (ref 0.0–0.7)
Eosinophils Relative: 0.8 % (ref 0.0–5.0)
HCT: 40.8 % (ref 36.0–46.0)
Hemoglobin: 13.6 g/dL (ref 12.0–15.0)
Lymphocytes Relative: 18.6 % (ref 12.0–46.0)
Lymphs Abs: 1.4 10*3/uL (ref 0.7–4.0)
MCHC: 33.5 g/dL (ref 30.0–36.0)
MCV: 86.9 fL (ref 78.0–100.0)
Monocytes Absolute: 0.5 10*3/uL (ref 0.1–1.0)
Monocytes Relative: 6.3 % (ref 3.0–12.0)
Neutro Abs: 5.7 10*3/uL (ref 1.4–7.7)
Neutrophils Relative %: 73.7 % (ref 43.0–77.0)
Platelets: 500 10*3/uL — ABNORMAL HIGH (ref 150.0–400.0)
RBC: 4.69 Mil/uL (ref 3.87–5.11)
RDW: 13.5 % (ref 11.5–15.5)
WBC: 7.7 10*3/uL (ref 4.0–10.5)

## 2023-12-01 LAB — VITAMIN D 25 HYDROXY (VIT D DEFICIENCY, FRACTURES): VITD: 41.63 ng/mL (ref 30.00–100.00)

## 2023-12-01 NOTE — Telephone Encounter (Signed)
Left message for pt to return my call per PCP below:  Do you mind telling Amy [name] that it appears her insurance does not cover steroid creams.  I would recommend that she use over the counter hydrocortisone cream to the area under her eye making sure not to get any in her eye.

## 2023-12-01 NOTE — Progress Notes (Signed)
Subjective:     Patient ID: Amy [name], female    DOB: 03/07/1951, 72 y.o.   MRN: [id number]  Chief Complaint  Patient presents with   Annual Exam    HPI  Discussed the use of AI scribe software for clinical note transcription with the patient, who gave verbal consent to proceed.  History of Present Illness   The patient presents for an annual physical. She reports no current health concerns. She has been proactive about her health, receiving both the flu and COVID-19 vaccines earlier in the year. She also reports having received both doses of the shingles vaccine in 12/30/18.  The patient maintains a healthy lifestyle, including a balanced diet and regular exercise. She walks eight miles daily, split between morning and evening sessions. She also reports no issues with vision or hearing, and she has regular eye exams.  The patient has a dry skin spot that has been bothering her. She has no other skin concerns. She also reports occasional leg pain, similar to what she experienced as a child. The pain is not severe and does not interfere with her daily activities.  The patient has a history of thin bones and is allergic to Fosamax. She has decided against Prolia due to concerns about potential side effects. She takes a calcium supplement daily.       Immunizations: up to date Diet: healthy Exercise: walks 8 miles a day  Colonoscopy: 08/26/2019 Dexa: 12/19/2022 osteoporosis allergic to fosamax and declines prolia Pap Smear: 05/10/2023 Mammogram: 12/19/22     Past Medical History:  Diagnosis Date   Fracture    ankle fracture 5/11   GERD (gastroesophageal reflux disease)    Osteoporosis 12/22/2014   Post-operative nausea and vomiting     Past Surgical History:  Procedure Laterality Date   AUGMENTATION MAMMAPLASTY Bilateral    COLONOSCOPY  11/23/2007   FRACTURE SURGERY  05/11/2010   lft ankle ORIF   FRACTURE SURGERY Left 01/19/16   left arm fracture   HERNIA REPAIR  2008/12/30    umbilical hernia/bilat inguinal repair    Family History  Problem Relation Age of Onset   Kidney failure Father    CAD Father    Arthritis Sister    Colon polyps Sister    Cancer Sister        uterine cancer   Breast cancer Paternal Uncle    Colon cancer Neg Hx    Esophageal cancer Neg Hx    Stomach cancer Neg Hx    Rectal cancer Neg Hx     Social History   Socioeconomic History   Marital status: Widowed    Spouse name: Not on file   Number of children: 2   Years of education: Not on file   Highest education level: Not on file  Occupational History   Occupation: Marine scientist: [employer]  Tobacco Use   Smoking status: Never   Smokeless tobacco: Never  Vaping Use   Vaping status: Never Used  Substance and Sexual Activity   Alcohol use: No   Drug use: No   Sexual activity: Not on file  Other Topics Concern   Not on file  Social History Narrative   Regular exercise:  Walks daily   Caffeine use:  2 cups coffee daily   Widowed- husband died in 31-Dec-2011   She has 2 daughters   No grand children   Works at the post office   Social Drivers of Health  Financial Resource Strain: Not on file  Food Insecurity: Not on file  Transportation Needs: Not on file  Physical Activity: Not on file  Stress: Not on file  Social Connections: Not on file  Intimate Partner Violence: Not on file    Outpatient Medications Prior to Visit  Medication Sig Dispense Refill   Calcium Carbonate-Vitamin D 600-400 MG-UNIT per chew tablet Chew 1 tablet by mouth 2 (two) times daily.     amoxicillin (AMOXIL) 500 MG capsule Take 1 capsule (500 mg total) by mouth 3 (three) times daily for 10 days. 30 capsule 0   meloxicam (MOBIC) 7.5 MG tablet Take 2 tablets (15 mg total) by mouth 2 (two) times daily as needed for pain. 30 tablet 0   No facility-administered medications prior to visit.    Allergies  Allergen Reactions   Alendronate Sodium Other (See Comments)    Tightness in throat  and chest.   Codeine Nausea Only    Review of Systems  Constitutional:  Negative for weight loss.  HENT:  Negative for congestion and hearing loss.   Eyes:  Negative for blurred vision.  Respiratory:  Negative for cough.   Cardiovascular:  Negative for leg swelling.  Gastrointestinal:  Negative for constipation and diarrhea.  Genitourinary:  Negative for dysuria and frequency.  Musculoskeletal:  Positive for joint pain (occasional right knee/leg pain). Negative for myalgias.  Skin:  Negative for rash.  Neurological:  Negative for headaches.  Psychiatric/Behavioral:  Negative for depression. The patient is not nervous/anxious.        Objective:    Physical Exam   BP 117/62 (BP Location: Right Arm, Patient Position: Sitting, Cuff Size: Small)   Pulse 77   Temp 98.7 F (37.1 C) (Oral)   Resp 16   Ht 5\' 4"  (1.626 m)   Wt 113 lb (51.3 kg)   SpO2 100%   BMI 19.40 kg/m  Wt Readings from Last 3 Encounters:  12/01/23 113 lb (51.3 kg)  05/10/23 117 lb (53.1 kg)  04/19/23 116 lb (52.6 kg)  Physical Exam  Constitutional: She is oriented to person, place, and time. She appears well-developed and well-nourished. No distress.  HENT:  Head: Normocephalic and atraumatic.  Right Ear: Tympanic membrane and ear canal normal.  Left Ear: Tympanic membrane and ear canal normal.  Mouth/Throat: Oropharynx is clear and moist.  Eyes: Pupils are equal, round, and reactive to light. No scleral icterus.  Neck: Normal range of motion. No thyromegaly present.  Cardiovascular: Normal rate and regular rhythm.   No murmur heard. Pulmonary/Chest: Effort normal and breath sounds normal. No respiratory distress. He has no wheezes. She has no rales. She exhibits no tenderness.  Abdominal: Soft. Bowel sounds are normal. She exhibits no distension and no mass. There is no tenderness. There is no rebound and no guarding.  Musculoskeletal: She exhibits no edema.  Lymphadenopathy:    She has no cervical  adenopathy.  Neurological: She is alert and oriented to person, place, and time. She has normal patellar reflexes. She exhibits normal muscle tone. Coordination normal.  Skin: Skin is warm and dry. Dry eczematous patch beneath left eyelid Psychiatric: She has a normal mood and affect. Her behavior is normal. Judgment and thought content normal.  Breast/pelvic: deferred            Assessment & Plan:        Assessment & Plan:   Problem List Items Addressed This Visit       Unprioritized   Routine  general medical examination at a health care facility   -immunizations reviewed and up to date -Colo up to date -Order mammogram to be done after January 8th. -Continue regular exercise, walking 8 miles daily. -Check metabolic panel, including potassium, liver and kidney function. -Check complete blood count. -Check back in a year unless needed sooner.      Osteoporosis   Relevant Orders   VITAMIN D 25 Hydroxy (Vit-D Deficiency, Fractures)   Hyperkalemia   Relevant Orders   Comp Met (CMET)   Eczema   It appears her insurance does not cover steroid creams.  I would recommend that she use over the counter hydrocortisone cream to the area under her eye making sure not to get any in her eye.      Other Visit Diagnoses       Breast cancer screening by mammogram    -  Primary   Relevant Orders   MM 3D SCREENING MAMMOGRAM BILATERAL BREAST     Thrombocytosis       Relevant Orders   CBC w/Diff       I have discontinued Silas [place]. Assefa's meloxicam and amoxicillin. I am also having her maintain her Calcium Carbonate-Vitamin D.  No orders of the defined types were placed in this encounter.

## 2023-12-01 NOTE — Patient Instructions (Signed)
VISIT SUMMARY:  Today, you came in for your annual physical. You reported no current health concerns and have been proactive about your health by receiving both the flu and COVID-19 vaccines earlier this year. You also received both doses of the shingles vaccine in 2019. You maintain a healthy lifestyle with a balanced diet and regular exercise, walking eight miles daily. You mentioned a dry skin spot that has been bothering you and occasional leg pain similar to what you experienced as a child. You have a history of thin bones and are allergic to Fosamax, and you have decided against taking Prolia due to concerns about potential side effects. You take a calcium supplement daily.  YOUR PLAN:  -DRY SKIN: You have a dry spot on your skin that is causing discomfort. Dry skin can be caused by various factors, including weather, dehydration, or skin conditions. Please apply over the counter hydrocortisone to the area making sure not to get any in your eye.  You can also apply aquaphor ointment to the area as needed.  It appears that your insurance does not cover prescription steroid creams.  -OSTEOPOROSIS: Osteoporosis is a condition where bones become weak and brittle. Given your history of thin bones and allergies to certain medications, you will continue taking a calcium supplement (Caltrate) 600mg  twice daily. We will also check your Vitamin D level to ensure it is within a healthy range.  -GENERAL HEALTH MAINTENANCE: For your overall health, we have ordered a mammogram to be done after January 8th. We recommend you continue your regular exercise routine of walking eight miles daily. We will also check your metabolic panel, including potassium, liver, and kidney function, as well as a complete blood count. Please check back in a year unless you need to come in sooner.  INSTRUCTIONS:  Please schedule a mammogram to be done after January 8th. Continue taking your calcium supplement (Caltrate) 600mg  twice  daily and maintain your regular exercise routine. We will check your Vitamin D level and other routine labs. Follow up in a year unless you have any concerns before then.

## 2023-12-01 NOTE — Assessment & Plan Note (Signed)
-  immunizations reviewed and up to date -Colo up to date -Order mammogram to be done after January 8th. -Continue regular exercise, walking 8 miles daily. -Check metabolic panel, including potassium, liver and kidney function. -Check complete blood count. -Check back in a year unless needed sooner.

## 2023-12-01 NOTE — Assessment & Plan Note (Signed)
It appears her insurance does not cover steroid creams.  I would recommend that she use over the counter hydrocortisone cream to the area under her eye making sure not to get any in her eye.

## 2023-12-11 ENCOUNTER — Other Ambulatory Visit (HOSPITAL_BASED_OUTPATIENT_CLINIC_OR_DEPARTMENT_OTHER): Payer: Self-pay

## 2023-12-28 ENCOUNTER — Other Ambulatory Visit: Payer: Self-pay | Admitting: Family

## 2023-12-28 ENCOUNTER — Ambulatory Visit (HOSPITAL_BASED_OUTPATIENT_CLINIC_OR_DEPARTMENT_OTHER)
Admission: RE | Admit: 2023-12-28 | Discharge: 2023-12-28 | Disposition: A | Discharge status: Home / Self Care | Payer: BC Managed Care – PPO | Source: Ambulatory Visit | Attending: Family | Admitting: Family

## 2023-12-28 ENCOUNTER — Encounter (HOSPITAL_BASED_OUTPATIENT_CLINIC_OR_DEPARTMENT_OTHER): Payer: Self-pay

## 2023-12-28 DIAGNOSIS — Z1231 Encounter for screening mammogram for malignant neoplasm of breast: Secondary | ICD-10-CM

## 2023-12-28 DIAGNOSIS — E875 Hyperkalemia: Secondary | ICD-10-CM

## 2023-12-28 DIAGNOSIS — Z0000 Encounter for general adult medical examination without abnormal findings: Secondary | ICD-10-CM

## 2023-12-28 DIAGNOSIS — D75839 Thrombocytosis, unspecified: Secondary | ICD-10-CM

## 2023-12-28 DIAGNOSIS — L309 Dermatitis, unspecified: Secondary | ICD-10-CM

## 2023-12-28 DIAGNOSIS — M810 Age-related osteoporosis without current pathological fracture: Secondary | ICD-10-CM

## 2024-01-02 ENCOUNTER — Other Ambulatory Visit (HOSPITAL_BASED_OUTPATIENT_CLINIC_OR_DEPARTMENT_OTHER): Payer: Self-pay

## 2024-01-02 ENCOUNTER — Ambulatory Visit (INDEPENDENT_AMBULATORY_CARE_PROVIDER_SITE_OTHER): Payer: BC Managed Care – PPO | Admitting: Family

## 2024-01-02 VITALS — BP 118/63 | HR 99 | Temp 98.4°F | Resp 16 | Ht 64.5 in | Wt 117.0 lb

## 2024-01-02 DIAGNOSIS — R110 Nausea: Secondary | ICD-10-CM | POA: Diagnosis not present

## 2024-01-02 LAB — COMPREHENSIVE METABOLIC PANEL
ALT: 14 U/L (ref 0–35)
AST: 22 U/L (ref 0–37)
Albumin: 4.6 g/dL (ref 3.5–5.2)
Alkaline Phosphatase: 61 U/L (ref 39–117)
BUN: 17 mg/dL (ref 6–23)
CO2: 32 meq/L (ref 19–32)
Calcium: 9.8 mg/dL (ref 8.4–10.5)
Chloride: 96 meq/L (ref 96–112)
Creatinine, Ser: 0.68 mg/dL (ref 0.40–1.20)
GFR: 88.26 mL/min (ref >60.00)
Glucose, Bld: 85 mg/dL (ref 70–99)
Potassium: 4.8 meq/L (ref 3.5–5.1)
Sodium: 134 meq/L — ABNORMAL LOW (ref 135–145)
Total Bilirubin: 0.4 mg/dL (ref 0.2–1.2)
Total Protein: 7 g/dL (ref 6.0–8.3)

## 2024-01-02 LAB — CBC WITH DIFFERENTIAL/PLATELET
Basophils Absolute: 0.1 10*3/uL (ref 0.0–0.1)
Basophils Relative: 1 % (ref 0.0–3.0)
Eosinophils Absolute: 0.1 10*3/uL (ref 0.0–0.7)
Eosinophils Relative: 0.9 % (ref 0.0–5.0)
HCT: 40 % (ref 36.0–46.0)
Hemoglobin: 13.2 g/dL (ref 12.0–15.0)
Lymphocytes Relative: 23.7 % (ref 12.0–46.0)
Lymphs Abs: 1.5 10*3/uL (ref 0.7–4.0)
MCHC: 33.1 g/dL (ref 30.0–36.0)
MCV: 86.5 fL (ref 78.0–100.0)
Monocytes Absolute: 0.3 10*3/uL (ref 0.1–1.0)
Monocytes Relative: 5.2 % (ref 3.0–12.0)
Neutro Abs: 4.5 10*3/uL (ref 1.4–7.7)
Neutrophils Relative %: 69.2 % (ref 43.0–77.0)
Platelets: 500 10*3/uL — ABNORMAL HIGH (ref 150.0–400.0)
RBC: 4.62 Mil/uL (ref 3.87–5.11)
RDW: 13.9 % (ref 11.5–15.5)
WBC: 6.4 10*3/uL (ref 4.0–10.5)

## 2024-01-02 LAB — LIPASE: Lipase: 16 U/L (ref 11.0–59.0)

## 2024-01-02 MED ORDER — ONDANSETRON HCL 4 MG PO TABS
4.0000 mg | ORAL_TABLET | Freq: Three times a day (TID) | ORAL | 0 refills | Status: AC | PRN
  Filled 2024-01-02: qty 30, 10d supply, fill #0

## 2024-01-02 MED ORDER — PANTOPRAZOLE SODIUM 40 MG PO TBEC
40.0000 mg | DELAYED_RELEASE_TABLET | Freq: Every day | ORAL | 3 refills | Status: DC
  Filled 2024-01-02: qty 30, 30d supply, fill #0

## 2024-01-02 NOTE — Assessment & Plan Note (Signed)
New.  Possible causes include cholecystitis, pancreatitis, gastritis/ulcer, H Pylori.  Obtain studies as below. Add pantoprazole 40mg  once daily and prn zofran. Follow up in 1 month. If studies are not revealing and symptoms do not improve with pantoprazole, will plan GI referral at that time.

## 2024-01-02 NOTE — Patient Instructions (Signed)
VISIT SUMMARY:  You came in today because you have been experiencing constant nausea since Christmas. You mentioned that over-the-counter medications provide some relief but not consistently, and you dislike taking them. You also mentioned occasional heartburn, which you manage by burping. There is no associated pain, vomiting, or changes in bowel movements.  YOUR PLAN:  -CHRONIC NAUSEA: Chronic nausea is a persistent feeling of sickness in the stomach. We will start by ordering some basic lab tests, including liver function tests and pancreatic enzymes, to check for any underlying issues. We will also do an H. pylori breath test and an abdominal ultrasound to evaluate your liver and gallbladder. I have prescribed medication to help with the nausea as needed. If there is no improvement or if the tests do not show any issues, we will refer you to a gastrointestinal specialist. Please follow up in one month or sooner if your symptoms worsen.  INSTRUCTIONS:  Please follow up in one month, or sooner if your symptoms worsen. We will review the results of your tests and decide on the next steps.

## 2024-01-02 NOTE — Progress Notes (Signed)
Subjective:     Patient ID: Amy [name], female    DOB: 22-Feb-1951, 73 y.o.   MRN: [id number]  Chief Complaint  Patient presents with   Nausea    Patient complains of feeling nauseous "most of the time".     HPI  Discussed the use of AI scribe software for clinical note transcription with the patient, who gave verbal consent to proceed.  History of Present Illness   The patient, with [name] history of osteoporosis, presents with chronic nausea that has been worsening since Christmas. She describes the nausea as constant, waking up and going to bed with it. She denies associated abdominal pain, vomiting, or changes in bowel movements. She has tried over-the-counter remedies such as Pepto-Bismol and Gasex which provide some relief but not consistently. She expresses [name] dislike for taking these medications. She also mentions occasional heartburn symptoms, which she manages by burping.          There are no preventive care reminders to display for this patient.  Past Medical History:  Diagnosis Date   Fracture    ankle fracture 5/11   GERD (gastroesophageal reflux disease)    Osteoporosis 12/22/2014   Post-operative nausea and vomiting     Past Surgical History:  Procedure Laterality Date   AUGMENTATION MAMMAPLASTY Bilateral    COLONOSCOPY  11/23/2007   FRACTURE SURGERY  05/11/2010   lft ankle ORIF   FRACTURE SURGERY Left 01/19/16   left arm fracture   HERNIA REPAIR  Jan 15, 2008   umbilical hernia/bilat inguinal repair    Family History  Problem Relation Age of Onset   Kidney failure Father    CAD Father    Arthritis Sister    Colon polyps Sister    Cancer Sister        uterine cancer   Breast cancer Paternal Uncle    Colon cancer Neg Hx    Esophageal cancer Neg Hx    Stomach cancer Neg Hx    Rectal cancer Neg Hx     Social History   Socioeconomic History   Marital status: Widowed    Spouse name: Not on file   Number of children: 2   Years of education: Not on  file   Highest education level: Not on file  Occupational History   Occupation: Marine scientist: [employer]  Tobacco Use   Smoking status: Never   Smokeless tobacco: Never  Vaping Use   Vaping status: Never Used  Substance and Sexual Activity   Alcohol use: No   Drug use: No   Sexual activity: Not on file  Other Topics Concern   Not on file  Social History Narrative   Regular exercise:  Walks daily   Caffeine use:  2 cups coffee daily   Widowed- husband died in 14-Jan-2011   She has 2 daughters   No grand children   Works at the post office   Social Drivers of Corporate investment banker Strain: Not on file  Food Insecurity: Not on file  Transportation Needs: Not on file  Physical Activity: Not on file  Stress: Not on file  Social Connections: Not on file  Intimate Partner Violence: Not on file    Outpatient Medications Prior to Visit  Medication Sig Dispense Refill   Calcium Carbonate-Vitamin D 600-400 MG-UNIT per chew tablet Chew 1 tablet by mouth 2 (two) times daily.     No facility-administered medications prior to visit.    Allergies  Allergen Reactions   Alendronate Sodium Other (See Comments)    Tightness in throat and chest.   Codeine Nausea Only    ROS See HPI    Objective:    Physical Exam Constitutional:      General: She is not in acute distress.    Appearance: Normal appearance. She is well-developed.  HENT:     Head: Normocephalic and atraumatic.     Right Ear: External ear normal.     Left Ear: External ear normal.  Eyes:     General: No scleral icterus. Neck:     Thyroid: No thyromegaly.  Cardiovascular:     Rate and Rhythm: Normal rate and regular rhythm.     Heart sounds: Normal heart sounds. No murmur heard. Pulmonary:     Effort: Pulmonary effort is normal. No respiratory distress.     Breath sounds: Normal breath sounds. No wheezing.  Abdominal:     General: Bowel sounds are normal. There is no distension.     Palpations:  Abdomen is soft.     Tenderness: There is no abdominal tenderness.  Musculoskeletal:     Cervical back: Neck supple.  Skin:    General: Skin is warm and dry.  Neurological:     Mental Status: She is alert and oriented to person, place, and time.  Psychiatric:        Mood and Affect: Mood normal.        Behavior: Behavior normal.        Thought Content: Thought content normal.        Judgment: Judgment normal.      BP 118/63 (BP Location: Right Arm, Patient Position: Sitting, Cuff Size: Small)   Pulse 99   Temp 98.4 F (36.9 C) (Oral)   Resp 16   Ht 5' 4.5" (1.638 m)   Wt 117 lb (53.1 kg)   SpO2 100%   BMI 19.77 kg/m  Wt Readings from Last 3 Encounters:  01/02/24 117 lb (53.1 kg)  12/01/23 113 lb (51.3 kg)  05/10/23 117 lb (53.1 kg)       Assessment & Plan:   Problem List Items Addressed This Visit       Unprioritized   Nausea - Primary   New.  Possible causes include cholecystitis, pancreatitis, gastritis/ulcer, H Pylori.  Obtain studies as below. Add pantoprazole 40mg  once daily and prn zofran. Follow up in 1 month. If studies are not revealing and symptoms do not improve with pantoprazole, will plan GI referral at that time.       Relevant Medications   pantoprazole (PROTONIX) 40 MG tablet   ondansetron (ZOFRAN) 4 MG tablet   Other Relevant Orders   US Abdomen Complete   H. pylori breath test   Comp Met (CMET)   Lipase   CBC w/Diff    I am having Amy [name]. Amy [name] start on pantoprazole and ondansetron. I am also having her maintain her Calcium Carbonate-Vitamin D.  Meds ordered this encounter  Medications   pantoprazole (PROTONIX) 40 MG tablet    Sig: Take 1 tablet (40 mg total) by mouth daily.    Dispense:  30 tablet    Refill:  3    Supervising Provider:   Danise Edge [name] [[id number]]   ondansetron (ZOFRAN) 4 MG tablet    Sig: Take 1 tablet (4 mg total) by mouth every 8 (eight) hours as needed for nausea or vomiting.    Dispense:  30 tablet    Refill:  0    Supervising Provider:   Danise Edge [name] [phone]

## 2024-01-03 ENCOUNTER — Encounter: Payer: Self-pay | Admitting: Family

## 2024-01-03 LAB — H. PYLORI BREATH TEST: H. pylori Breath Test: NOT DETECTED

## 2024-01-09 ENCOUNTER — Ambulatory Visit (HOSPITAL_BASED_OUTPATIENT_CLINIC_OR_DEPARTMENT_OTHER): Admission: RE | Admit: 2024-01-09 | Payer: Federal, State, Local not specified - PPO | Source: Ambulatory Visit

## 2024-01-11 ENCOUNTER — Ambulatory Visit (HOSPITAL_BASED_OUTPATIENT_CLINIC_OR_DEPARTMENT_OTHER)
Admission: RE | Admit: 2024-01-11 | Discharge: 2024-01-11 | Disposition: A | Discharge status: Home / Self Care | Payer: Federal, State, Local not specified - PPO | Source: Ambulatory Visit | Attending: Family | Admitting: Family

## 2024-01-11 ENCOUNTER — Encounter: Payer: Self-pay | Admitting: Family

## 2024-01-11 DIAGNOSIS — R110 Nausea: Secondary | ICD-10-CM | POA: Insufficient documentation

## 2024-01-30 ENCOUNTER — Other Ambulatory Visit (HOSPITAL_BASED_OUTPATIENT_CLINIC_OR_DEPARTMENT_OTHER): Payer: Self-pay

## 2024-01-30 MED ORDER — AMOXICILLIN 500 MG PO CAPS
500.0000 mg | ORAL_CAPSULE | Freq: Three times a day (TID) | ORAL | 0 refills | Status: AC
Start: 2024-01-30 — End: 2024-02-09
  Filled 2024-01-30: qty 30, 10d supply, fill #0

## 2024-02-02 ENCOUNTER — Ambulatory Visit: Payer: Federal, State, Local not specified - PPO | Admitting: Family

## 2024-02-02 VITALS — BP 111/67 | HR 79 | Temp 97.9°F | Resp 16 | Ht 64.5 in | Wt 120.0 lb

## 2024-02-02 DIAGNOSIS — R110 Nausea: Secondary | ICD-10-CM

## 2024-02-02 NOTE — Progress Notes (Signed)
Subjective:     Patient ID: Amy [name], female    DOB: January 04, 1951, 73 y.o.   MRN: [id number]  Chief Complaint  Patient presents with   Nausea    Patient still feeling nauseated     HPI  Discussed the use of AI scribe software for clinical note transcription with the patient, who gave verbal consent to proceed.  History of Present Illness   Amy [name] is a 73 year old female who presents with chronic nausea.  She has been experiencing chronic nausea that has been worsening since Christmas. The nausea is constant, occurring both upon waking and before going to bed, and is typically worse in the afternoon. Today, she noted it was particularly bad in the morning at work. No vomiting or abdominal pain. Sometimes she feels well, but at other times, such as last night, she experienced severe nausea, disrupting her sleep.  She has tried [employer] and [employer] with minimal relief and uses pantoprazole and Zofran as needed, but these have not alleviated her symptoms. She continues to take Pepto Bismol almost daily and has also tried ginger candy. She is concerned about potential interactions of medications like Pepto Bismol and Tums with her condition.  She recently started taking amoxicillin on Tuesday for a dental implant to prevent infection. An ultrasound of her gallbladder showed no gallstones or inflammation. Blood work, including a complete blood count, lipase, liver tests, and H. pylori test, were all normal, except for her platelets, which are consistently slightly elevated but stable for her.  Her bowel movements are generally okay, though she notes that some medications seem to affect their consistency, making them neither hard nor loose. No abdominal tenderness.          There are no preventive care reminders to display for this patient.  Past Medical History:  Diagnosis Date   Fracture    ankle fracture 5/11   GERD (gastroesophageal reflux disease)    Osteoporosis  12/22/2014   Post-operative nausea and vomiting     Past Surgical History:  Procedure Laterality Date   AUGMENTATION MAMMAPLASTY Bilateral    COLONOSCOPY  11/23/2007   FRACTURE SURGERY  05/11/2010   lft ankle ORIF   FRACTURE SURGERY Left 01/19/16   left arm fracture   HERNIA REPAIR  2009   umbilical hernia/bilat inguinal repair    Family History  Problem Relation Age of Onset   Kidney failure Father    CAD Father    Arthritis Sister    Colon polyps Sister    Cancer Sister        uterine cancer   Breast cancer Paternal Uncle    Colon cancer Neg Hx    Esophageal cancer Neg Hx    Stomach cancer Neg Hx    Rectal cancer Neg Hx     Social History   Socioeconomic History   Marital status: Widowed    Spouse name: Not on file   Number of children: 2   Years of education: Not on file   Highest education level: Not on file  Occupational History   Occupation: Marine scientist: [employer]  Tobacco Use   Smoking status: Never   Smokeless tobacco: Never  Vaping Use   Vaping status: Never Used  Substance and Sexual Activity   Alcohol use: No   Drug use: No   Sexual activity: Not on file  Other Topics Concern   Not on file  Social History Narrative  Regular exercise:  Walks daily   Caffeine use:  2 cups coffee daily   Widowed- husband died in 02-05-2011   She has 2 daughters   No grand children   Works at the post office   Social Drivers of Corporate investment banker Strain: Not on [employer] Insecurity: Not on file  Transportation Needs: Not on file  Physical Activity: Not on file  Stress: Not on file  Social Connections: Not on file  Intimate Partner Violence: Not on file    Outpatient Medications Prior to Visit  Medication Sig Dispense Refill   amoxicillin (AMOXIL) 500 MG capsule Take 1 capsule (500 mg total) by mouth 3 (three) times daily for 10 days. 30 capsule 0   Calcium Carbonate-Vitamin D 600-400 MG-UNIT per chew tablet Chew 1 tablet by mouth 2 (two)  times daily.     ondansetron (ZOFRAN) 4 MG tablet Take 1 tablet (4 mg total) by mouth every 8 (eight) hours as needed for nausea or vomiting. 30 tablet 0   pantoprazole (PROTONIX) 40 MG tablet Take 1 tablet (40 mg total) by mouth daily. 30 tablet 3   No facility-administered medications prior to visit.    Allergies  Allergen Reactions   Alendronate Sodium Other (See Comments)    Tightness in throat and chest.   Codeine Nausea Only    ROS     Objective:    Physical Exam Constitutional:      General: She is not in acute distress.    Appearance: Normal appearance. She is well-developed.  HENT:     Head: Normocephalic and atraumatic.     Right Ear: External ear normal.     Left Ear: External ear normal.  Eyes:     General: No scleral icterus. Neck:     Thyroid: No thyromegaly.  Cardiovascular:     Rate and Rhythm: Normal rate and regular rhythm.     Heart sounds: Normal heart sounds. No murmur heard. Pulmonary:     Effort: Pulmonary effort is normal. No respiratory distress.     Breath sounds: Normal breath sounds. No wheezing.  Abdominal:     General: Bowel sounds are normal. There is no distension.     Palpations: Abdomen is soft. There is no mass.     Tenderness: There is no abdominal tenderness.  Musculoskeletal:     Cervical back: Neck supple.  Skin:    General: Skin is warm and dry.  Neurological:     Mental Status: She is alert and oriented to person, place, and time.  Psychiatric:        Mood and Affect: Mood normal.        Behavior: Behavior normal.        Thought Content: Thought content normal.        Judgment: Judgment normal.      BP 111/67 (BP Location: Right Arm, Patient Position: Sitting, Cuff Size: Small)   Pulse 79   Temp 97.9 F (36.6 C) (Oral)   Resp 16   Ht 5' 4.5" (1.638 m)   Wt 120 lb (54.4 kg)   SpO2 99%   BMI 20.28 kg/m  Wt Readings from Last 3 Encounters:  02/02/24 120 lb (54.4 kg)  01/02/24 117 lb (53.1 kg)  12/01/23 113 lb  (51.3 kg)       Assessment & Plan:   Problem List Items Addressed This Visit       Unprioritized   Nausea - Primary    Persistent  despite Pantoprazole and Zofran. No vomiting or abdominal pain. Normal gallbladder ultrasound and blood work. Negative for H. pylori. Currently on Amoxicillin for dental implant. -Order HIDA scan to assess gallbladder function. -Refer to Gastroenterology for further evaluation, possibly including endoscopy and gastric emptying study. -Continue Zofran as needed.      Relevant Orders   NM Hepato W/Eject Fract   Ambulatory referral to Gastroenterology    I am having Silas [place]. Wolfrey maintain her Calcium Carbonate-Vitamin D, pantoprazole, ondansetron, and amoxicillin.  No orders of the defined types were placed in this encounter.

## 2024-02-02 NOTE — Assessment & Plan Note (Signed)
  Persistent despite Pantoprazole and Zofran. No vomiting or abdominal pain. Normal gallbladder ultrasound and blood work. Negative for H. pylori. Currently on Amoxicillin for dental implant. -Order HIDA scan to assess gallbladder function. -Refer to Gastroenterology for further evaluation, possibly including endoscopy and gastric emptying study. -Continue Zofran as needed.

## 2024-02-02 NOTE — Patient Instructions (Signed)
VISIT SUMMARY:  Today, we discussed your ongoing issue with chronic nausea, which has been worsening since Christmas. We reviewed your symptoms, current medications, and recent test results.  YOUR PLAN:  -CHRONIC NAUSEA: Chronic nausea is a persistent feeling of sickness in the stomach that can be caused by various factors. Despite using medications like Pantoprazole and Zofran, your symptoms have not improved. Your recent tests, including an ultrasound of your gallbladder and blood work, were normal. To further investigate, we will order a HIDA scan to assess your gallbladder function and refer you to a gastroenterologist for additional evaluation, which may include an endoscopy and a gastric emptying study. Continue taking Zofran as needed.  INSTRUCTIONS:  Please schedule a HIDA scan to assess your gallbladder function. Additionally, we will refer you to a gastroenterologist for further evaluation. Continue taking Zofran as needed for nausea.

## 2024-02-22 ENCOUNTER — Telehealth: Payer: Self-pay | Admitting: Internal Medicine

## 2024-02-22 NOTE — Telephone Encounter (Signed)
 Amy [name] this pt has a referral from her PCP and just needs to be scheduled for an appt.

## 2024-02-22 NOTE — Telephone Encounter (Signed)
 Patient is calling to know what further testing is needing to be done regarding her nausea. Patient is requesting a call back. Please advise.

## 2024-02-22 NOTE — Telephone Encounter (Signed)
 Patient scheduled for OV

## 2024-04-18 ENCOUNTER — Other Ambulatory Visit (HOSPITAL_BASED_OUTPATIENT_CLINIC_OR_DEPARTMENT_OTHER): Payer: Self-pay

## 2024-04-18 ENCOUNTER — Ambulatory Visit: Admitting: Physician Assistant

## 2024-04-18 ENCOUNTER — Encounter: Payer: Self-pay | Admitting: Physician Assistant

## 2024-04-18 DIAGNOSIS — R110 Nausea: Secondary | ICD-10-CM | POA: Diagnosis not present

## 2024-04-18 MED ORDER — PANTOPRAZOLE SODIUM 40 MG PO TBEC
40.0000 mg | DELAYED_RELEASE_TABLET | Freq: Two times a day (BID) | ORAL | 1 refills | Status: AC
  Filled 2024-04-18: qty 60, 30d supply, fill #0
  Filled 2024-12-03: qty 60, 30d supply, fill #1

## 2024-04-18 NOTE — Progress Notes (Addendum)
 Chief Complaint: Nausea  HPI:    Amy [name] is a 73 year old female with a past medical history as listed below including reflux, known to Dr. Willy Harvest, who was referred to me by Dorrene Gaucher, NP for a complaint of nausea.      08/26/2019 colonoscopy with diverticulosis in the sigmoid colon and otherwise normal.  No repeat recommended due to age.    01/02/2024 CBC with platelets elevated at 500 and otherwise normal, normal lipase, negative H. pylori breath test and normal CMP.    01/11/2024 abdominal ultrasound was normal.    Today, patient explains that since Christmas of 2024 she has had issues with nausea.  At first it seemed to be all day long and even got a little worse and now it seems to be slightly better most of the time and will just bother her in the morning and then tend to go away.  It is so extreme sometimes that she does feel like she is going to vomit but she does not.  She did try 1 or 2 Pantoprazole  but did not take these consistently and occasionally uses a Zofran  which sometimes helps and sometimes does not.  Tells me the biggest help is using Pepto-Bismol or Tums.  She is just worried that it may get worse and does not want anything to happen.    She is not currently on any supplements or other medicines including NSAIDs.  Does not drink alcohol.    Denies fever, chills, weight loss, abdominal pain, change in bowel habits or symptoms that awaken her from sleep.  Past Medical History:  Diagnosis Date   Fracture    ankle fracture 5/11   GERD (gastroesophageal reflux disease)    Osteoporosis 12/22/2014   Post-operative nausea and vomiting     Past Surgical History:  Procedure Laterality Date   AUGMENTATION MAMMAPLASTY Bilateral    COLONOSCOPY  11/23/2007   FRACTURE SURGERY  05/11/2010   lft ankle ORIF   FRACTURE SURGERY Left 01/19/16   left arm fracture   HERNIA REPAIR  2009   umbilical hernia/bilat inguinal repair    Current Outpatient Medications  Medication  Sig Dispense Refill   Calcium Carbonate-Vitamin D  600-400 MG-UNIT per chew tablet Chew 1 tablet by mouth 2 (two) times daily. (Patient not taking: Reported on 04/18/2024)     ondansetron  (ZOFRAN ) 4 MG tablet Take 1 tablet (4 mg total) by mouth every 8 (eight) hours as needed for nausea or vomiting. (Patient not taking: Reported on 04/18/2024) 30 tablet 0   pantoprazole  (PROTONIX ) 40 MG tablet Take 1 tablet (40 mg total) by mouth daily. (Patient not taking: Reported on 04/18/2024) 30 tablet 3   No current facility-administered medications for this visit.    Allergies as of 04/18/2024 - Review Complete 04/18/2024  Allergen Reaction Noted   Alendronate sodium Other (See Comments) 10/19/2012   Codeine Nausea Only 12/15/2014    Family History  Problem Relation Age of Onset   Kidney failure Father    CAD Father    Arthritis Sister    Colon polyps Sister    Cancer Sister        uterine cancer   Breast cancer Paternal Uncle    Colon cancer Neg Hx    Esophageal cancer Neg Hx    Stomach cancer Neg Hx    Rectal cancer Neg Hx     Social History   Socioeconomic History   Marital status: Widowed    Spouse name: Not on file  Number of children: 2   Years of education: Not on file   Highest education level: Not on file  Occupational History   Occupation: Marine scientist: [employer]  Tobacco Use   Smoking status: Never   Smokeless tobacco: Never  Vaping Use   Vaping status: Never Used  Substance and Sexual Activity   Alcohol use: No   Drug use: No   Sexual activity: Not on file  Other Topics Concern   Not on file  Social History Narrative   Regular exercise:  Walks daily   Caffeine use:  2 cups coffee daily   Widowed- husband died in 05/14/11   She has 2 daughters   No grand children   Works at the post office   Social Drivers of Corporate investment banker Strain: Not on file  Food Insecurity: Not on file  Transportation Needs: Not on file  Physical Activity: Not on file   Stress: Not on file  Social Connections: Not on file  Intimate Partner Violence: Not on file    Review of Systems:    Constitutional: No weight loss, fever or chills Skin: No rash  Cardiovascular: No chest pain Respiratory: No SOB  Gastrointestinal: See HPI and otherwise negative Genitourinary: No dysuria Neurological: No headache, dizziness or syncope Musculoskeletal: No new muscle or joint pain Hematologic: No bleeding  Psychiatric: No history of depression or anxiety   Physical Exam:  Vital signs: BP 112/60   Pulse 76   Ht 5' 4.5" (1.638 m)   Wt 120 lb (54.4 kg)   BMI 20.28 kg/m    Constitutional:   Pleasant Caucasian female appears to be in NAD, Well developed, Well nourished, alert and cooperative Head:  Normocephalic and atraumatic. Eyes:   PEERL, EOMI. No icterus. Conjunctiva pink. Ears:  Normal auditory acuity. Neck:  Supple Throat: Oral cavity and pharynx without inflammation, swelling or lesion.  Respiratory: Respirations even and unlabored. Lungs clear to auscultation bilaterally.   No wheezes, crackles, or rhonchi.  Cardiovascular: Normal S1, S2. No MRG. Regular rate and rhythm. No peripheral edema, cyanosis or pallor.  Gastrointestinal:  Soft, nondistended, nontender. No rebound or guarding. Normal bowel sounds. No appreciable masses or hepatomegaly. Rectal:  Not performed.  Msk:  Symmetrical without gross deformities. Without edema, no deformity or joint abnormality.  Neurologic:  Alert and  oriented x4;  grossly normal neurologically.  Skin:   Dry and intact without significant lesions or rashes. Psychiatric: Demonstrates good judgement and reason without abnormal affect or behaviors.  RELEVANT LABS AND IMAGING: CBC    Component Value Date/Time   WBC 6.4 01/02/2024 1000   RBC 4.62 01/02/2024 1000   HGB 13.2 01/02/2024 1000   HCT 40.0 01/02/2024 1000   PLT 500.0 (H) 01/02/2024 1000   MCV 86.5 01/02/2024 1000   MCH 28.9 12/03/2013 0827   MCHC 33.1  01/02/2024 1000   RDW 13.9 01/02/2024 1000   LYMPHSABS 1.5 01/02/2024 1000   MONOABS 0.3 01/02/2024 1000   EOSABS 0.1 01/02/2024 1000   BASOSABS 0.1 01/02/2024 1000    CMP     Component Value Date/Time   NA 134 (L) 01/02/2024 1000   K 4.8 01/02/2024 1000   CL 96 01/02/2024 1000   CO2 32 01/02/2024 1000   GLUCOSE 85 01/02/2024 1000   BUN 17 01/02/2024 1000   CREATININE 0.68 01/02/2024 1000   CREATININE 0.76 12/03/2013 0827   CALCIUM 9.8 01/02/2024 1000   PROT 7.0 01/02/2024 1000  ALBUMIN 4.6 01/02/2024 1000   AST 22 01/02/2024 1000   ALT 14 01/02/2024 1000   ALKPHOS 61 01/02/2024 1000   BILITOT 0.4 01/02/2024 1000   GFRNONAA 86 12/03/2013 0827   GFRAA >89 12/03/2013 0827    Assessment: 1.  Nausea: Slightly better but still pretty consistent over the past 6 months, no NSAID use or alcohol, no history of gastritis, currently not on any medications, healthy diet, the only thing that has helped is Pepto-Bismol or Tums, ultrasound normal, H. pylori testing negative, labs normal; consider gastritis most likely versus other  Plan: 1.  At this point we will trial patient on daily Pantoprazole  40 mg, 30 minutes before breakfast for the next 2 weeks.  She will then check in with me and let me know how she is doing.  If this is not helpful at all then would recommend proceeding with an EGD. 2.  Patient to follow in clinic with me in 3 months or sooner if necessary.  Reginal Capra, PA-C [facility] 04/18/2024, 9:49 AM  Cc: Dorrene Gaucher, NP   Primary gastroenterologist:  Agree with evaluation above.  So far GI workup with H. pylori testing ultrasound labs is negative.  She had a trivially low sodium so hyponatremia is probably not related.  In patients with nausea and no other GI symptoms, anxiety is always in the differential.  Chart review does not suggest that though I do not know if it has been explored specifically.  Await therapeutic trial with pantoprazole   regularly for 2 weeks.  Kenney Peacemaker, MD, [facility]

## 2024-04-18 NOTE — Patient Instructions (Signed)
 We have sent the following medications to your pharmacy for you to pick up at your convenience: Pantoprazole  40 mg twice daily 30-60 minutes before breakfast and dinner, for 2 weeks.   Call or send Mychart message with an update in 2 weeks.   _______________________________________________________  If your blood pressure at your visit was 140/90 or greater, please contact your primary care physician to follow up on this.  _______________________________________________________  If you are age 63 or older, your body mass index should be between 23-30. Your Body mass index is 20.28 kg/m. If this is out of the aforementioned range listed, please consider follow up with your Primary Care Provider.  If you are age 3 or younger, your body mass index should be between 19-25. Your Body mass index is 20.28 kg/m. If this is out of the aformentioned range listed, please consider follow up with your Primary Care Provider.   ________________________________________________________  The  GI providers would like to encourage you to use MYCHART to communicate with providers for non-urgent requests or questions.  Due to long hold times on the telephone, sending your provider a message by [facility] may be a faster and more efficient way to get a response.  Please allow 48 business hours for a response.  Please remember that this is for non-urgent requests.  _______________________________________________________

## 2024-05-13 ENCOUNTER — Other Ambulatory Visit (HOSPITAL_BASED_OUTPATIENT_CLINIC_OR_DEPARTMENT_OTHER): Payer: Self-pay

## 2024-05-13 MED ORDER — TRIAMCINOLONE ACETONIDE 0.1 % EX OINT
1.0000 | TOPICAL_OINTMENT | Freq: Two times a day (BID) | CUTANEOUS | 3 refills | Status: AC
  Filled 2024-05-13: qty 80, 30d supply, fill #0
  Filled 2024-12-03: qty 80, 30d supply, fill #1

## 2024-05-13 MED ORDER — CLOBETASOL PROPIONATE 0.05 % EX SOLN
1.0000 | Freq: Two times a day (BID) | CUTANEOUS | 5 refills | Status: AC
  Filled 2024-05-13: qty 50, 30d supply, fill #0
  Filled 2024-12-03: qty 50, 30d supply, fill #1

## 2024-12-03 ENCOUNTER — Other Ambulatory Visit (HOSPITAL_BASED_OUTPATIENT_CLINIC_OR_DEPARTMENT_OTHER): Payer: Self-pay

## 2024-12-03 ENCOUNTER — Encounter: Payer: Self-pay | Admitting: Family

## 2024-12-03 ENCOUNTER — Ambulatory Visit: Payer: Self-pay | Admitting: Family

## 2024-12-03 ENCOUNTER — Ambulatory Visit: Payer: Federal, State, Local not specified - PPO | Admitting: Family

## 2024-12-03 VITALS — BP 114/62 | HR 70 | Temp 97.8°F | Resp 16 | Ht 64.5 in | Wt 124.8 lb

## 2024-12-03 DIAGNOSIS — Z0000 Encounter for general adult medical examination without abnormal findings: Secondary | ICD-10-CM

## 2024-12-03 DIAGNOSIS — Z1231 Encounter for screening mammogram for malignant neoplasm of breast: Secondary | ICD-10-CM

## 2024-12-03 DIAGNOSIS — R110 Nausea: Secondary | ICD-10-CM

## 2024-12-03 DIAGNOSIS — M810 Age-related osteoporosis without current pathological fracture: Secondary | ICD-10-CM

## 2024-12-03 DIAGNOSIS — E871 Hypo-osmolality and hyponatremia: Secondary | ICD-10-CM | POA: Diagnosis not present

## 2024-12-03 DIAGNOSIS — D75839 Thrombocytosis, unspecified: Secondary | ICD-10-CM

## 2024-12-03 LAB — CBC WITH DIFFERENTIAL/PLATELET
Basophils Absolute: 0 K/uL (ref 0.0–0.1)
Basophils Relative: 0.7 % (ref 0.0–3.0)
Eosinophils Absolute: 0.1 K/uL (ref 0.0–0.7)
Eosinophils Relative: 1.6 % (ref 0.0–5.0)
HCT: 37.1 % (ref 36.0–46.0)
Hemoglobin: 12.4 g/dL (ref 12.0–15.0)
Lymphocytes Relative: 26.9 % (ref 12.0–46.0)
Lymphs Abs: 1.7 K/uL (ref 0.7–4.0)
MCHC: 33.6 g/dL (ref 30.0–36.0)
MCV: 83.4 fl (ref 78.0–100.0)
Monocytes Absolute: 0.5 K/uL (ref 0.1–1.0)
Monocytes Relative: 7.6 % (ref 3.0–12.0)
Neutro Abs: 3.9 K/uL (ref 1.4–7.7)
Neutrophils Relative %: 63.2 % (ref 43.0–77.0)
Platelets: 434 K/uL — ABNORMAL HIGH (ref 150.0–400.0)
RBC: 4.44 Mil/uL (ref 3.87–5.11)
RDW: 13.5 % (ref 11.5–15.5)
WBC: 6.2 K/uL (ref 4.0–10.5)

## 2024-12-03 LAB — COMPREHENSIVE METABOLIC PANEL WITH GFR
ALT: 15 U/L (ref 3–35)
AST: 24 U/L (ref 5–37)
Albumin: 4.4 g/dL (ref 3.5–5.2)
Alkaline Phosphatase: 51 U/L (ref 39–117)
BUN: 18 mg/dL (ref 6–23)
CO2: 32 meq/L (ref 19–32)
Calcium: 10.2 mg/dL (ref 8.4–10.5)
Chloride: 97 meq/L (ref 96–112)
Creatinine, Ser: 0.69 mg/dL (ref 0.40–1.20)
GFR: 87.38 mL/min
Glucose, Bld: 88 mg/dL (ref 70–99)
Potassium: 4.5 meq/L (ref 3.5–5.1)
Sodium: 134 meq/L — ABNORMAL LOW (ref 135–145)
Total Bilirubin: 0.5 mg/dL (ref 0.2–1.2)
Total Protein: 6.8 g/dL (ref 6.0–8.3)

## 2024-12-03 NOTE — Assessment & Plan Note (Signed)
" °  Nausea improves with carbohydrate intake. Discussed potential acid-related causes. - Continue pantoprazole  as prescribed. - Encouraged dietary adjustments with carbohydrate intake to manage nausea. - Consider Tums or Pepto-Bismol for symptomatic relief. "

## 2024-12-03 NOTE — Assessment & Plan Note (Signed)
" °  Routine wellness visit. Immunizations current. Discussed RSV vaccination, cervical cancer screening, and HPV vaccination. Mammogram and bone density screening due. - Ordered mammogram and bone density scan. - Recommended RSV vaccination at pharmacy. - Continue regular exercise and healthy diet. - Continue regular dental and vision check-ups. "

## 2024-12-03 NOTE — Progress Notes (Signed)
 "  Subjective:     Patient ID: Amy [name], female    DOB: [birth year], 73 y.o.   MRN: [id number]  Chief Complaint  Patient presents with   Annual Exam    HPI  Discussed the use of AI scribe software for clinical note transcription with the patient, who gave verbal consent to proceed.  History of Present Illness Amy [name] is a 73 year old female who presents for an annual physical exam.  She has no specific concerns during her annual physical exam. Her immunizations are up to date, including flu, COVID, pneumonia, and shingles vaccines. She is considering the RSV vaccine.  She maintains a healthy lifestyle, exercising daily by walking. She has a sweet tooth but balances it with fruits. She visits the dentist twice a year and the eye doctor annually. Recently, she injured her eye on a hook and went to the doctor the next day to have it checked.  She experiences morning nausea, which improves after eating a banana or carbohydrates. She has been prescribed pantoprazole  for acid-related issues but has run out of the medication. She sometimes uses Tums or Pepto-Bismol for relief.  She has a history of dry skin and itching, particularly on her scalp, for which she uses prescribed medication and shampoos. She attributes some of the dryness to frequent hand washing at work.  No cough, cold symptoms, or urinary concerns. She experiences occasional knee pain due to prolonged standing at work but finds relief with walking. No headaches, depression, or anxiety.     There are no preventive care reminders to display for this patient.  Past Medical History:  Diagnosis Date   Fracture    ankle fracture 5/11   GERD (gastroesophageal reflux disease)    Osteoporosis 12/22/2014   Post-operative nausea and vomiting     Past Surgical History:  Procedure Laterality Date   AUGMENTATION MAMMAPLASTY Bilateral    COLONOSCOPY  11/23/2007   FRACTURE SURGERY  05/11/2010   lft ankle ORIF    FRACTURE SURGERY Left 01/19/16   left arm fracture   HERNIA REPAIR  January 12, 2008   umbilical hernia/bilat inguinal repair    Family History  Problem Relation Age of Onset   Kidney failure Father    CAD Father    Arthritis Sister    Colon polyps Sister    Cancer Sister        uterine cancer   Breast cancer Paternal Uncle    Colon cancer Neg Hx    Esophageal cancer Neg Hx    Stomach cancer Neg Hx    Rectal cancer Neg Hx     Social History   Socioeconomic History   Marital status: Widowed    Spouse name: Not on file   Number of children: 2   Years of education: Not on file   Highest education level: Not on file  Occupational History   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: [employer]   Occupation: clerk  Tobacco Use   Smoking status: Never   Smokeless tobacco: Never  Vaping Use   Vaping status: Never Used  Substance and Sexual Activity   Alcohol use: No   Drug use: No   Sexual activity: Not on file  Other Topics Concern   Not on file  Social History Narrative   Regular exercise:  Walks daily   Caffeine use:  2 cups coffee daily   Widowed- husband died in 01-11-11   She has 2 daughters   No grand  children   Works at the post office   Social Drivers of Health   Tobacco Use: Low Risk (12/03/2024)   Patient History    Smoking Tobacco Use: Never    Smokeless Tobacco Use: Never    Passive Exposure: Not on file  Financial Resource Strain: Not on file  Food Insecurity: Not on file  Transportation Needs: Not on file  Physical Activity: Not on file  Stress: Not on file  Social Connections: Not on file  Intimate Partner Violence: Not on file  Depression (PHQ2-9): Low Risk (12/03/2024)   Depression (PHQ2-9)    PHQ-2 Score: 2  Alcohol Screen: Not on file  Housing: Not on file  Utilities: Not on file  Health Literacy: Not on file    Outpatient Medications Prior to Visit  Medication Sig Dispense Refill   Calcium Carbonate-Vitamin D  600-400 MG-UNIT per chew tablet Chew 1  tablet by mouth 2 (two) times daily.     clobetasol  (TEMOVATE ) 0.05 % external solution Apply 1 Application topically 2 (two) times daily. 50 mL 5   ondansetron  (ZOFRAN ) 4 MG tablet Take 1 tablet (4 mg total) by mouth every 8 (eight) hours as needed for nausea or vomiting. 30 tablet 0   pantoprazole  (PROTONIX ) 40 MG tablet Take 1 tablet (40 mg total) by mouth 2 (two) times daily. 60 tablet 1   triamcinolone  ointment (KENALOG ) 0.1 % Apply 1 Application topically 2 (two) times daily. 80 g 3   No facility-administered medications prior to visit.    Allergies[1]  Review of Systems  Constitutional:  Negative for weight loss.  HENT:  Negative for congestion and hearing loss.   Eyes:  Negative for blurred vision.  Respiratory:  Negative for cough.   Cardiovascular:  Negative for leg swelling.  Gastrointestinal:  Positive for nausea. Negative for constipation and diarrhea.  Genitourinary:  Negative for dysuria and frequency.  Musculoskeletal:  Negative for joint pain and myalgias.       Mild left knee pain  Skin:  Negative for rash.  Neurological:  Negative for headaches.  Psychiatric/Behavioral:  Negative for depression. The patient is not nervous/anxious.        Objective:    Physical Exam   BP 114/62 (BP Location: Right Arm, Patient Position: Sitting, Cuff Size: Small)   Pulse 70   Temp 97.8 F (36.6 C) (Oral)   Resp 16   Ht 5' 4.5 (1.638 m)   Wt 124 lb 12.8 oz (56.6 kg)   SpO2 100%   BMI 21.09 kg/m  Wt Readings from Last 3 Encounters:  12/03/24 124 lb 12.8 oz (56.6 kg)  04/18/24 120 lb (54.4 kg)  02/02/24 120 lb (54.4 kg)      Physical Exam  Constitutional: She is oriented to person, place, and time. She appears well-developed and well-nourished. No distress.  HENT:  Head: Normocephalic and atraumatic.  Right Ear: Tympanic membrane and ear canal normal.  Left Ear: Tympanic membrane and ear canal normal.  Mouth/Throat: Oropharynx is clear and moist.  Eyes: Pupils  are equal, round, and reactive to light. No scleral icterus.  Neck: Normal range of motion. No thyromegaly present.  Cardiovascular: Normal rate and regular rhythm.   No murmur heard. Pulmonary/Chest: Effort normal and breath sounds normal. No respiratory distress. He has no wheezes. She has no rales. She exhibits no tenderness.  Abdominal: Soft. Bowel sounds are normal. She exhibits no distension and no mass. There is no tenderness. There is no rebound and no guarding.  Musculoskeletal:  She exhibits no edema.  Lymphadenopathy:    She has no cervical adenopathy.  Neurological: She is alert and oriented to person, place, and time. She has normal patellar reflexes. She exhibits normal muscle tone. Coordination normal.  Skin: Skin is warm and dry.  Psychiatric: She has a normal mood and affect. Her behavior is normal. Judgment and thought content normal.           Assessment & Plan:    Assessment & Plan:   Problem List Items Addressed This Visit       Unprioritized   Routine general medical examination at a health care facility - Primary    Routine wellness visit. Immunizations current. Discussed RSV vaccination, cervical cancer screening, and HPV vaccination. Mammogram and bone density screening due. - Ordered mammogram and bone density scan. - Recommended RSV vaccination at pharmacy. - Continue regular exercise and healthy diet. - Continue regular dental and vision check-ups.      Osteoporosis    Prefers lifestyle modifications over medication due to family history of adverse effects from Fosamax. Regular walking routine maintained. - Ordered bone density scan. - Encouraged intake of 1200 mg of calcium daily through diet and supplements. - Continue regular walking exercise.       Relevant Orders   DG Bone Density   Chronic nausea    Nausea improves with carbohydrate intake. Discussed potential acid-related causes. - Continue pantoprazole  as prescribed. - Encouraged  dietary adjustments with carbohydrate intake to manage nausea. - Consider Tums or Pepto-Bismol for symptomatic relief.      Other Visit Diagnoses       Breast cancer screening by mammogram       Relevant Orders   MM 3D SCREENING MAMMOGRAM BILATERAL BREAST     Thrombocytosis       Relevant Orders   CBC w/Diff     Hyponatremia       Relevant Orders   Comp Met (CMET)      Assessment & Plan    I am having Amy [name] maintain her Calcium Carbonate-Vitamin D , ondansetron , pantoprazole , clobetasol , and triamcinolone  ointment.  No orders of the defined types were placed in this encounter.     [1]  Allergies Allergen Reactions   Alendronate Sodium Other (See Comments)    Tightness in throat and chest.   Codeine Nausea Only   "

## 2024-12-03 NOTE — Assessment & Plan Note (Signed)
" °  Prefers lifestyle modifications over medication due to family history of adverse effects from Fosamax. Regular walking routine maintained. - Ordered bone density scan. - Encouraged intake of 1200 mg of calcium daily through diet and supplements. - Continue regular walking exercise.  "

## 2024-12-03 NOTE — Patient Instructions (Signed)
" °  VISIT SUMMARY: You had your annual physical exam today. Your immunizations are up to date, and we discussed the RSV vaccine. You maintain a healthy lifestyle with regular exercise and a balanced diet. We also addressed your morning nausea, dry skin, and occasional knee pain. No new major concerns were identified.  YOUR PLAN: -ADULT WELLNESS VISIT: This is a routine check-up to ensure your overall health. Your immunizations are current, and we discussed the RSV vaccine, cervical cancer screening, and HPV vaccination. We ordered a mammogram and bone density scan. Continue your regular exercise, healthy diet, and routine dental and vision check-ups.  -OSTEOPOROSIS: Osteoporosis is a condition where bones become weak and brittle. We ordered a bone density scan and encouraged you to take 1200 mg of calcium daily through diet and supplements. Continue your regular walking exercise.  -CHRONIC NAUSEA: Chronic nausea is persistent nausea that can be related to various causes, including acid-related issues. Continue taking pantoprazole  as prescribed and adjust your diet to include carbohydrates to manage nausea. You can also use Tums or Pepto-Bismol for relief.  -THROMBOCYTOSIS: Thrombocytosis is a condition where there are too many platelets in the blood. We ordered a complete blood count to monitor your platelet levels.  -HYPONATREMIA: Hyponatremia is a condition where sodium levels in the blood are lower than normal. We ordered a metabolic panel to monitor your sodium levels.  INSTRUCTIONS: Please follow up with the ordered mammogram, bone density scan, complete blood count, and metabolic panel. Consider getting the RSV vaccination at the pharmacy. Continue your regular exercise, healthy diet, and routine dental and vision check-ups.                     "

## 2024-12-16 ENCOUNTER — Other Ambulatory Visit (HOSPITAL_BASED_OUTPATIENT_CLINIC_OR_DEPARTMENT_OTHER): Payer: Self-pay

## 2024-12-16 MED ORDER — DOXYCYCLINE HYCLATE 100 MG PO CAPS
100.0000 mg | ORAL_CAPSULE | Freq: Two times a day (BID) | ORAL | 0 refills | Status: AC
  Filled 2024-12-16: qty 20, 10d supply, fill #0

## 2024-12-16 MED ORDER — PROMETHAZINE-DM 6.25-15 MG/5ML PO SYRP
5.0000 mL | ORAL_SOLUTION | Freq: Four times a day (QID) | ORAL | 0 refills | Status: AC | PRN
  Filled 2024-12-16: qty 140, 7d supply, fill #0

## 2024-12-31 ENCOUNTER — Other Ambulatory Visit (HOSPITAL_BASED_OUTPATIENT_CLINIC_OR_DEPARTMENT_OTHER)

## 2025-01-02 ENCOUNTER — Other Ambulatory Visit: Payer: Self-pay | Admitting: Family

## 2025-01-02 ENCOUNTER — Ambulatory Visit (HOSPITAL_BASED_OUTPATIENT_CLINIC_OR_DEPARTMENT_OTHER)
Admission: RE | Admit: 2025-01-02 | Discharge: 2025-01-02 | Disposition: A | Discharge status: Home / Self Care | Source: Ambulatory Visit | Attending: Family | Admitting: Family

## 2025-01-02 ENCOUNTER — Encounter (HOSPITAL_BASED_OUTPATIENT_CLINIC_OR_DEPARTMENT_OTHER): Payer: Self-pay

## 2025-01-02 DIAGNOSIS — M810 Age-related osteoporosis without current pathological fracture: Secondary | ICD-10-CM | POA: Insufficient documentation

## 2025-01-02 DIAGNOSIS — E871 Hypo-osmolality and hyponatremia: Secondary | ICD-10-CM

## 2025-01-02 DIAGNOSIS — R110 Nausea: Secondary | ICD-10-CM

## 2025-01-02 DIAGNOSIS — D75839 Thrombocytosis, unspecified: Secondary | ICD-10-CM

## 2025-01-02 DIAGNOSIS — Z0000 Encounter for general adult medical examination without abnormal findings: Secondary | ICD-10-CM

## 2025-01-02 DIAGNOSIS — Z1231 Encounter for screening mammogram for malignant neoplasm of breast: Secondary | ICD-10-CM

## 2025-01-06 ENCOUNTER — Ambulatory Visit: Payer: Self-pay | Admitting: Family

## 2025-12-09 ENCOUNTER — Encounter: Admitting: Family
